# Patient Record
Sex: Female | Born: 1935 | Race: White | Hispanic: No | Smoking: Former smoker
Health system: Southern US, Community
[De-identification: ages and names within clinical notes are randomized; demographics above are authoritative.]

## PROBLEM LIST (undated history)

## (undated) DIAGNOSIS — IMO0001 Reserved for inherently not codable concepts without codable children: Secondary | ICD-10-CM

## (undated) DIAGNOSIS — K7290 Hepatic failure, unspecified without coma: Secondary | ICD-10-CM

## (undated) DIAGNOSIS — K056 Periodontal disease, unspecified: Secondary | ICD-10-CM

## (undated) DIAGNOSIS — K219 Gastro-esophageal reflux disease without esophagitis: Secondary | ICD-10-CM

## (undated) DIAGNOSIS — K7581 Nonalcoholic steatohepatitis (NASH): Secondary | ICD-10-CM

## (undated) DIAGNOSIS — K6389 Other specified diseases of intestine: Secondary | ICD-10-CM

## (undated) DIAGNOSIS — E860 Dehydration: Secondary | ICD-10-CM

## (undated) DIAGNOSIS — F32A Depression, unspecified: Secondary | ICD-10-CM

## (undated) DIAGNOSIS — R188 Other ascites: Secondary | ICD-10-CM

## (undated) DIAGNOSIS — C801 Malignant (primary) neoplasm, unspecified: Secondary | ICD-10-CM

## (undated) DIAGNOSIS — Z85828 Personal history of other malignant neoplasm of skin: Secondary | ICD-10-CM

## (undated) DIAGNOSIS — K209 Esophagitis, unspecified: Secondary | ICD-10-CM

## (undated) DIAGNOSIS — F329 Major depressive disorder, single episode, unspecified: Secondary | ICD-10-CM

## (undated) DIAGNOSIS — D696 Thrombocytopenia, unspecified: Secondary | ICD-10-CM

## (undated) DIAGNOSIS — K3189 Other diseases of stomach and duodenum: Secondary | ICD-10-CM

## (undated) DIAGNOSIS — K7460 Unspecified cirrhosis of liver: Secondary | ICD-10-CM

## (undated) DIAGNOSIS — I8510 Secondary esophageal varices without bleeding: Secondary | ICD-10-CM

## (undated) DIAGNOSIS — F419 Anxiety disorder, unspecified: Secondary | ICD-10-CM

## (undated) DIAGNOSIS — K760 Fatty (change of) liver, not elsewhere classified: Secondary | ICD-10-CM

## (undated) DIAGNOSIS — J449 Chronic obstructive pulmonary disease, unspecified: Secondary | ICD-10-CM

## (undated) DIAGNOSIS — K222 Esophageal obstruction: Secondary | ICD-10-CM

## (undated) DIAGNOSIS — C44621 Squamous cell carcinoma of skin of unspecified upper limb, including shoulder: Secondary | ICD-10-CM

## (undated) DIAGNOSIS — K639 Disease of intestine, unspecified: Secondary | ICD-10-CM

## (undated) DIAGNOSIS — K068 Other specified disorders of gingiva and edentulous alveolar ridge: Secondary | ICD-10-CM

## (undated) DIAGNOSIS — K766 Portal hypertension: Secondary | ICD-10-CM

## (undated) DIAGNOSIS — D732 Chronic congestive splenomegaly: Secondary | ICD-10-CM

## (undated) HISTORY — DX: Other diseases of stomach and duodenum: K31.89

## (undated) HISTORY — DX: Other specified disorders of gingiva and edentulous alveolar ridge: K06.8

## (undated) HISTORY — DX: Portal hypertension: K76.6

## (undated) HISTORY — DX: Periodontal disease, unspecified: K05.6

## (undated) HISTORY — DX: Major depressive disorder, single episode, unspecified: F32.9

## (undated) HISTORY — DX: Dehydration: E86.0

## (undated) HISTORY — DX: Disease of intestine, unspecified: K63.9

## (undated) HISTORY — DX: Unspecified cirrhosis of liver: K74.60

## (undated) HISTORY — DX: Depression, unspecified: F32.A

## (undated) HISTORY — PX: VAGINAL HYSTERECTOMY: SUR661

## (undated) HISTORY — DX: Hepatic failure, unspecified without coma: K72.90

## (undated) HISTORY — DX: Secondary esophageal varices without bleeding: I85.10

## (undated) HISTORY — DX: Thrombocytopenia, unspecified: D69.6

## (undated) HISTORY — DX: Fatty (change of) liver, not elsewhere classified: K76.0

## (undated) HISTORY — DX: Other specified diseases of intestine: K63.89

## (undated) HISTORY — DX: Chronic congestive splenomegaly: D73.2

## (undated) HISTORY — DX: Esophagitis, unspecified: K20.9

## (undated) HISTORY — PX: APPENDECTOMY: SHX54

## (undated) HISTORY — DX: Other ascites: R18.8

## (undated) HISTORY — DX: Personal history of other malignant neoplasm of skin: Z85.828

## (undated) HISTORY — DX: Esophageal obstruction: K22.2

## (undated) HISTORY — PX: BACK SURGERY: SHX140

## (undated) HISTORY — DX: Nonalcoholic steatohepatitis (NASH): K75.81

## (undated) HISTORY — DX: Squamous cell carcinoma of skin of unspecified upper limb, including shoulder: C44.621

---

## 1988-09-10 DIAGNOSIS — K209 Esophagitis, unspecified without bleeding: Secondary | ICD-10-CM

## 1988-09-10 HISTORY — DX: Esophagitis, unspecified without bleeding: K20.90

## 2005-01-29 ENCOUNTER — Encounter: Payer: Self-pay | Admitting: Geriatric Medicine

## 2005-02-10 ENCOUNTER — Encounter: Payer: Self-pay | Admitting: Geriatric Medicine

## 2010-11-16 DIAGNOSIS — M546 Pain in thoracic spine: Secondary | ICD-10-CM | POA: Insufficient documentation

## 2012-05-25 DIAGNOSIS — Z85828 Personal history of other malignant neoplasm of skin: Secondary | ICD-10-CM

## 2012-05-25 HISTORY — DX: Personal history of other malignant neoplasm of skin: Z85.828

## 2012-07-09 DIAGNOSIS — C44621 Squamous cell carcinoma of skin of unspecified upper limb, including shoulder: Secondary | ICD-10-CM

## 2012-07-09 HISTORY — DX: Squamous cell carcinoma of skin of unspecified upper limb, including shoulder: C44.621

## 2012-08-25 ENCOUNTER — Emergency Department: Payer: Self-pay | Admitting: Emergency Medicine

## 2012-08-25 LAB — CBC
HCT: 41.4 % (ref 35.0–47.0)
HGB: 13.7 g/dL (ref 12.0–16.0)
MCH: 31.5 pg (ref 26.0–34.0)
MCV: 95 fL (ref 80–100)
RBC: 4.35 10*6/uL (ref 3.80–5.20)
RDW: 15.1 % — ABNORMAL HIGH (ref 11.5–14.5)

## 2012-08-25 LAB — COMPREHENSIVE METABOLIC PANEL
Albumin: 3.1 g/dL — ABNORMAL LOW (ref 3.4–5.0)
Alkaline Phosphatase: 106 U/L (ref 50–136)
Anion Gap: 7 (ref 7–16)
Calcium, Total: 8.6 mg/dL (ref 8.5–10.1)
Chloride: 105 mmol/L (ref 98–107)
Co2: 26 mmol/L (ref 21–32)
EGFR (African American): >60
EGFR (Non-African Amer.): >60
Glucose: 96 mg/dL (ref 65–99)
Osmolality: 275 (ref 275–301)
Potassium: 4.1 mmol/L (ref 3.5–5.1)
SGOT(AST): 26 U/L (ref 15–37)
Total Protein: 6.1 g/dL — ABNORMAL LOW (ref 6.4–8.2)

## 2012-08-25 LAB — TROPONIN I: Troponin-I: <0.02 ng/mL

## 2012-08-25 LAB — CK TOTAL AND CKMB (NOT AT ARMC): CK, Total: 66 U/L (ref 21–215)

## 2012-09-30 DIAGNOSIS — K639 Disease of intestine, unspecified: Secondary | ICD-10-CM

## 2012-09-30 DIAGNOSIS — K6389 Other specified diseases of intestine: Secondary | ICD-10-CM

## 2012-09-30 DIAGNOSIS — I8510 Secondary esophageal varices without bleeding: Secondary | ICD-10-CM

## 2012-09-30 DIAGNOSIS — K766 Portal hypertension: Secondary | ICD-10-CM | POA: Insufficient documentation

## 2012-09-30 DIAGNOSIS — K3189 Other diseases of stomach and duodenum: Secondary | ICD-10-CM

## 2012-09-30 HISTORY — DX: Other diseases of stomach and duodenum: K31.89

## 2012-09-30 HISTORY — DX: Disease of intestine, unspecified: K63.9

## 2012-09-30 HISTORY — DX: Secondary esophageal varices without bleeding: I85.10

## 2012-09-30 HISTORY — DX: Other specified diseases of intestine: K63.89

## 2012-11-19 ENCOUNTER — Inpatient Hospital Stay: Payer: Self-pay | Admitting: Internal Medicine

## 2012-11-19 LAB — CBC WITH DIFFERENTIAL/PLATELET
Basophil #: 0 10*3/uL (ref 0.0–0.1)
Basophil %: 0.5 %
HGB: 10.3 g/dL — ABNORMAL LOW (ref 12.0–16.0)
Lymphocyte %: 16.3 %
MCH: 32 pg (ref 26.0–34.0)
MCHC: 32.9 g/dL (ref 32.0–36.0)
MCV: 97 fL (ref 80–100)
Monocyte #: 0.7 x10 3/mm (ref 0.2–0.9)
Monocyte %: 9.4 %
Neutrophil #: 5.5 10*3/uL (ref 1.4–6.5)
Platelet: 56 10*3/uL — ABNORMAL LOW (ref 150–440)
RBC: 3.22 10*6/uL — ABNORMAL LOW (ref 3.80–5.20)

## 2012-11-19 LAB — LIPASE, BLOOD: Lipase: 247 U/L (ref 73–393)

## 2012-11-19 LAB — PROTIME-INR
INR: 1.3
Prothrombin Time: 16.2 secs — ABNORMAL HIGH (ref 11.5–14.7)

## 2012-11-19 LAB — CBC
HCT: 36.8 % (ref 35.0–47.0)
HGB: 12.3 g/dL (ref 12.0–16.0)
MCHC: 33.3 g/dL (ref 32.0–36.0)
Platelet: 88 10*3/uL — ABNORMAL LOW (ref 150–440)
RBC: 3.85 10*6/uL (ref 3.80–5.20)
RDW: 14.4 % (ref 11.5–14.5)

## 2012-11-19 LAB — COMPREHENSIVE METABOLIC PANEL
Alkaline Phosphatase: 134 U/L (ref 50–136)
Anion Gap: 5 — ABNORMAL LOW (ref 7–16)
BUN: 43 mg/dL — ABNORMAL HIGH (ref 7–18)
Bilirubin,Total: 1.6 mg/dL — ABNORMAL HIGH (ref 0.2–1.0)
Calcium, Total: 8.7 mg/dL (ref 8.5–10.1)
Creatinine: 1.04 mg/dL (ref 0.60–1.30)
EGFR (African American): 59 — ABNORMAL LOW
Glucose: 115 mg/dL — ABNORMAL HIGH (ref 65–99)
SGOT(AST): 36 U/L (ref 15–37)
SGPT (ALT): 26 U/L (ref 12–78)
Sodium: 139 mmol/L (ref 136–145)
Total Protein: 6 g/dL — ABNORMAL LOW (ref 6.4–8.2)

## 2012-11-20 LAB — CBC WITH DIFFERENTIAL/PLATELET
Basophil #: 0.1 10*3/uL (ref 0.0–0.1)
Basophil %: 1.3 %
Eosinophil #: 0.1 10*3/uL (ref 0.0–0.7)
Eosinophil %: 1.3 %
HGB: 9.1 g/dL — ABNORMAL LOW (ref 12.0–16.0)
HGB: 8.4 g/dL — ABNORMAL LOW (ref 12.0–16.0)
Lymphocyte %: 28.4 %
MCHC: 33.4 g/dL (ref 32.0–36.0)
MCHC: 32.9 g/dL (ref 32.0–36.0)
MCV: 95 fL (ref 80–100)
MCV: 95 fL (ref 80–100)
Monocyte #: 0.9 x10 3/mm (ref 0.2–0.9)
Neutrophil #: 4.4 10*3/uL (ref 1.4–6.5)
Neutrophil #: 3.9 10*3/uL (ref 1.4–6.5)
Neutrophil %: 58.4 %
Platelet: 89 10*3/uL — ABNORMAL LOW (ref 150–440)
RBC: 2.86 10*6/uL — ABNORMAL LOW (ref 3.80–5.20)
RDW: 14.6 % — ABNORMAL HIGH (ref 11.5–14.5)
RDW: 14.6 % — ABNORMAL HIGH (ref 11.5–14.5)
WBC: 7.6 10*3/uL (ref 3.6–11.0)
WBC: 6.4 10*3/uL (ref 3.6–11.0)

## 2012-11-20 LAB — BASIC METABOLIC PANEL
BUN: 51 mg/dL — ABNORMAL HIGH (ref 7–18)
Calcium, Total: 8 mg/dL — ABNORMAL LOW (ref 8.5–10.1)
EGFR (African American): >60
Glucose: 101 mg/dL — ABNORMAL HIGH (ref 65–99)
Osmolality: 301 (ref 275–301)
Potassium: 5 mmol/L (ref 3.5–5.1)

## 2012-11-21 LAB — BASIC METABOLIC PANEL
Anion Gap: 5 — ABNORMAL LOW (ref 7–16)
Calcium, Total: 7.8 mg/dL — ABNORMAL LOW (ref 8.5–10.1)
Chloride: 114 mmol/L — ABNORMAL HIGH (ref 98–107)
Creatinine: 1.17 mg/dL (ref 0.60–1.30)
EGFR (Non-African Amer.): 44 — ABNORMAL LOW
Glucose: 107 mg/dL — ABNORMAL HIGH (ref 65–99)
Osmolality: 301 (ref 275–301)

## 2012-11-21 LAB — CBC WITH DIFFERENTIAL/PLATELET
Basophil %: 0.8 %
Eosinophil #: 0.2 10*3/uL (ref 0.0–0.7)
Eosinophil %: 3.5 %
HGB: 7.8 g/dL — ABNORMAL LOW (ref 12.0–16.0)
Lymphocyte #: 1.6 10*3/uL (ref 1.0–3.6)
Lymphocyte %: 30.8 %
MCH: 31.8 pg (ref 26.0–34.0)
MCV: 96 fL (ref 80–100)
Monocyte %: 14.5 %
Neutrophil #: 2.7 10*3/uL (ref 1.4–6.5)
Neutrophil %: 50.4 %
Platelet: 72 10*3/uL — ABNORMAL LOW (ref 150–440)
RDW: 15.1 % — ABNORMAL HIGH (ref 11.5–14.5)
WBC: 5.3 10*3/uL (ref 3.6–11.0)

## 2012-11-21 LAB — MAGNESIUM: Magnesium: 1.7 mg/dL — ABNORMAL LOW

## 2012-11-22 LAB — HEMOGLOBIN: HGB: 8.3 g/dL — ABNORMAL LOW (ref 12.0–16.0)

## 2012-11-26 ENCOUNTER — Other Ambulatory Visit: Payer: Self-pay | Admitting: Family Medicine

## 2012-11-26 LAB — CBC WITH DIFFERENTIAL/PLATELET
Basophil #: 0 10*3/uL (ref 0.0–0.1)
Basophil %: 0.5 %
Eosinophil #: 0.1 10*3/uL (ref 0.0–0.7)
HGB: 9.2 g/dL — ABNORMAL LOW (ref 12.0–16.0)
Lymphocyte #: 1.3 10*3/uL (ref 1.0–3.6)
Lymphocyte %: 21.8 %
MCH: 32.2 pg (ref 26.0–34.0)
MCHC: 33.6 g/dL (ref 32.0–36.0)
Monocyte %: 12.4 %
Neutrophil #: 3.8 10*3/uL (ref 1.4–6.5)
Platelet: 75 10*3/uL — ABNORMAL LOW (ref 150–440)
RBC: 2.86 10*6/uL — ABNORMAL LOW (ref 3.80–5.20)
RDW: 15.4 % — ABNORMAL HIGH (ref 11.5–14.5)
WBC: 6 10*3/uL (ref 3.6–11.0)

## 2013-01-13 DIAGNOSIS — D732 Chronic congestive splenomegaly: Secondary | ICD-10-CM

## 2013-01-13 DIAGNOSIS — K068 Other specified disorders of gingiva and edentulous alveolar ridge: Secondary | ICD-10-CM

## 2013-01-13 DIAGNOSIS — K056 Periodontal disease, unspecified: Secondary | ICD-10-CM

## 2013-01-13 DIAGNOSIS — D696 Thrombocytopenia, unspecified: Secondary | ICD-10-CM

## 2013-01-13 HISTORY — DX: Periodontal disease, unspecified: K05.6

## 2013-01-13 HISTORY — DX: Thrombocytopenia, unspecified: D69.6

## 2013-01-13 HISTORY — DX: Chronic congestive splenomegaly: D73.2

## 2013-01-13 HISTORY — DX: Other specified disorders of gingiva and edentulous alveolar ridge: K06.8

## 2013-03-22 DIAGNOSIS — R188 Other ascites: Secondary | ICD-10-CM

## 2013-03-22 HISTORY — DX: Other ascites: R18.8

## 2013-04-15 ENCOUNTER — Ambulatory Visit: Payer: Self-pay | Admitting: Gastroenterology

## 2013-04-19 ENCOUNTER — Ambulatory Visit: Payer: Self-pay | Admitting: Gastroenterology

## 2013-05-17 ENCOUNTER — Ambulatory Visit: Payer: Self-pay | Admitting: Gastroenterology

## 2013-05-17 LAB — CREATININE, SERUM
Creatinine: 1.13 mg/dL (ref 0.60–1.30)
EGFR (African American): 54 — ABNORMAL LOW
GFR CALC NON AF AMER: 46 — AB

## 2013-05-17 LAB — POTASSIUM: POTASSIUM: 3.7 mmol/L (ref 3.5–5.1)

## 2013-05-24 ENCOUNTER — Ambulatory Visit: Payer: Self-pay | Admitting: Internal Medicine

## 2013-05-24 LAB — PLATELET COUNT: Platelet: 73 10*3/uL — ABNORMAL LOW (ref 150–440)

## 2013-05-24 LAB — TSH: Thyroid Stimulating Horm: 3.28 u[IU]/mL

## 2013-05-25 LAB — CEA: CEA: 4.2 ng/mL (ref 0.0–4.7)

## 2013-06-13 ENCOUNTER — Ambulatory Visit: Payer: Self-pay | Admitting: Internal Medicine

## 2013-07-15 ENCOUNTER — Ambulatory Visit: Payer: Self-pay | Admitting: Gastroenterology

## 2013-08-19 ENCOUNTER — Ambulatory Visit: Payer: Self-pay | Admitting: Pain Medicine

## 2013-08-27 ENCOUNTER — Other Ambulatory Visit: Payer: Self-pay | Admitting: Pain Medicine

## 2013-08-27 LAB — BASIC METABOLIC PANEL
ANION GAP: 3 — AB (ref 7–16)
BUN: 13 mg/dL (ref 7–18)
CALCIUM: 8.5 mg/dL (ref 8.5–10.1)
CHLORIDE: 100 mmol/L (ref 98–107)
CREATININE: 1.04 mg/dL (ref 0.60–1.30)
Co2: 33 mmol/L — ABNORMAL HIGH (ref 21–32)
GFR CALC AF AMER: 59 — AB
GFR CALC NON AF AMER: 51 — AB
Glucose: 102 mg/dL — ABNORMAL HIGH (ref 65–99)
Osmolality: 272 (ref 275–301)
Potassium: 4.1 mmol/L (ref 3.5–5.1)
Sodium: 136 mmol/L (ref 136–145)

## 2013-08-27 LAB — MAGNESIUM: Magnesium: 1.8 mg/dL

## 2013-08-27 LAB — HEPATIC FUNCTION PANEL A (ARMC)
Albumin: 2.9 g/dL — ABNORMAL LOW (ref 3.4–5.0)
Alkaline Phosphatase: 133 U/L — ABNORMAL HIGH
BILIRUBIN DIRECT: 0.3 mg/dL — AB (ref 0.00–0.20)
BILIRUBIN TOTAL: 0.9 mg/dL (ref 0.2–1.0)
SGOT(AST): 28 U/L (ref 15–37)
SGPT (ALT): 21 U/L (ref 12–78)
TOTAL PROTEIN: 5.8 g/dL — AB (ref 6.4–8.2)

## 2013-08-27 LAB — SEDIMENTATION RATE: Erythrocyte Sed Rate: 8 mm/hr (ref 0–30)

## 2013-09-01 ENCOUNTER — Ambulatory Visit: Payer: Self-pay | Admitting: Pain Medicine

## 2013-09-27 DIAGNOSIS — K760 Fatty (change of) liver, not elsewhere classified: Secondary | ICD-10-CM

## 2013-09-27 DIAGNOSIS — K7460 Unspecified cirrhosis of liver: Secondary | ICD-10-CM | POA: Insufficient documentation

## 2013-09-27 HISTORY — DX: Fatty (change of) liver, not elsewhere classified: K76.0

## 2013-09-30 ENCOUNTER — Ambulatory Visit: Payer: Self-pay | Admitting: Gastroenterology

## 2013-10-14 ENCOUNTER — Ambulatory Visit: Payer: Self-pay | Admitting: Gastroenterology

## 2013-10-15 ENCOUNTER — Ambulatory Visit: Payer: Self-pay | Admitting: Pain Medicine

## 2013-11-05 ENCOUNTER — Ambulatory Visit: Payer: Self-pay | Admitting: Gastroenterology

## 2013-11-05 LAB — CBC WITH DIFFERENTIAL/PLATELET
BASOS PCT: 0.7 %
Basophil #: 0 10*3/uL (ref 0.0–0.1)
EOS ABS: 0.1 10*3/uL (ref 0.0–0.7)
Eosinophil %: 2.7 %
HCT: 37.1 % (ref 35.0–47.0)
HGB: 12.4 g/dL (ref 12.0–16.0)
LYMPHS ABS: 0.8 10*3/uL — AB (ref 1.0–3.6)
Lymphocyte %: 21 %
MCH: 31.1 pg (ref 26.0–34.0)
MCHC: 33.5 g/dL (ref 32.0–36.0)
MCV: 93 fL (ref 80–100)
Monocyte #: 0.5 x10 3/mm (ref 0.2–0.9)
Monocyte %: 12.9 %
NEUTROS ABS: 2.4 10*3/uL (ref 1.4–6.5)
NEUTROS PCT: 62.7 %
Platelet: 69 10*3/uL — ABNORMAL LOW (ref 150–440)
RBC: 3.99 10*6/uL (ref 3.80–5.20)
RDW: 15.3 % — ABNORMAL HIGH (ref 11.5–14.5)
WBC: 3.8 10*3/uL (ref 3.6–11.0)

## 2013-11-05 LAB — PROTIME-INR
INR: 1.3
PROTHROMBIN TIME: 16.1 s — AB (ref 11.5–14.7)

## 2013-11-05 LAB — APTT: Activated PTT: 33.1 secs (ref 23.6–35.9)

## 2013-11-24 ENCOUNTER — Ambulatory Visit: Payer: Self-pay | Admitting: Gastroenterology

## 2013-11-26 ENCOUNTER — Emergency Department: Payer: Self-pay | Admitting: Emergency Medicine

## 2013-11-26 LAB — CBC WITH DIFFERENTIAL/PLATELET
Basophil #: 0 10*3/uL (ref 0.0–0.1)
Basophil %: 0.9 %
Eosinophil #: 0.1 10*3/uL (ref 0.0–0.7)
Eosinophil %: 3.1 %
HCT: 38.6 % (ref 35.0–47.0)
HGB: 12.6 g/dL (ref 12.0–16.0)
Lymphocyte #: 1 10*3/uL (ref 1.0–3.6)
Lymphocyte %: 24.4 %
MCH: 30.8 pg (ref 26.0–34.0)
MCHC: 32.5 g/dL (ref 32.0–36.0)
MCV: 95 fL (ref 80–100)
MONO ABS: 0.6 x10 3/mm (ref 0.2–0.9)
MONOS PCT: 14.2 %
Neutrophil #: 2.3 10*3/uL (ref 1.4–6.5)
Neutrophil %: 57.4 %
PLATELETS: 84 10*3/uL — AB (ref 150–440)
RBC: 4.08 10*6/uL (ref 3.80–5.20)
RDW: 14.9 % — ABNORMAL HIGH (ref 11.5–14.5)
WBC: 4 10*3/uL (ref 3.6–11.0)

## 2013-11-26 LAB — URINALYSIS, COMPLETE
BILIRUBIN, UR: NEGATIVE
Bacteria: NONE SEEN
Blood: NEGATIVE
GLUCOSE, UR: NEGATIVE mg/dL (ref 0–75)
Ketone: NEGATIVE
LEUKOCYTE ESTERASE: NEGATIVE
Nitrite: NEGATIVE
Ph: 5 (ref 4.5–8.0)
Protein: NEGATIVE
RBC,UR: <1 /HPF (ref 0–5)
Specific Gravity: 1.009 (ref 1.003–1.030)
Squamous Epithelial: 1
WBC UR: 1 /HPF (ref 0–5)

## 2013-11-26 LAB — COMPREHENSIVE METABOLIC PANEL
ALK PHOS: 103 U/L
ALT: 20 U/L (ref 12–78)
Albumin: 3.1 g/dL — ABNORMAL LOW (ref 3.4–5.0)
Anion Gap: 4 — ABNORMAL LOW (ref 7–16)
BUN: 13 mg/dL (ref 7–18)
Bilirubin,Total: 1.2 mg/dL — ABNORMAL HIGH (ref 0.2–1.0)
CREATININE: 1.39 mg/dL — AB (ref 0.60–1.30)
Calcium, Total: 8.3 mg/dL — ABNORMAL LOW (ref 8.5–10.1)
Chloride: 94 mmol/L — ABNORMAL LOW (ref 98–107)
Co2: 30 mmol/L (ref 21–32)
EGFR (African American): 41 — ABNORMAL LOW
GFR CALC NON AF AMER: 36 — AB
GLUCOSE: 115 mg/dL — AB (ref 65–99)
Osmolality: 258 (ref 275–301)
Potassium: 4.1 mmol/L (ref 3.5–5.1)
SGOT(AST): 34 U/L (ref 15–37)
SODIUM: 128 mmol/L — AB (ref 136–145)
Total Protein: 5.9 g/dL — ABNORMAL LOW (ref 6.4–8.2)

## 2013-11-26 LAB — AMMONIA: Ammonia, Plasma: 40 mcmol/L — ABNORMAL HIGH (ref 11–32)

## 2013-11-26 LAB — LIPASE, BLOOD: Lipase: 274 U/L (ref 73–393)

## 2013-12-02 ENCOUNTER — Ambulatory Visit: Payer: Self-pay | Admitting: Gastroenterology

## 2013-12-08 ENCOUNTER — Ambulatory Visit: Payer: Self-pay | Admitting: Gastroenterology

## 2013-12-22 ENCOUNTER — Ambulatory Visit: Payer: Self-pay | Admitting: Gastroenterology

## 2014-01-05 ENCOUNTER — Ambulatory Visit: Payer: Self-pay | Admitting: Gastroenterology

## 2014-01-19 ENCOUNTER — Ambulatory Visit: Payer: Self-pay | Admitting: Gastroenterology

## 2014-01-20 ENCOUNTER — Ambulatory Visit: Payer: Self-pay | Admitting: Gastroenterology

## 2014-01-28 ENCOUNTER — Ambulatory Visit: Payer: Self-pay | Admitting: Pain Medicine

## 2014-02-02 ENCOUNTER — Ambulatory Visit: Payer: Self-pay | Admitting: Gastroenterology

## 2014-02-09 ENCOUNTER — Ambulatory Visit: Payer: Self-pay | Admitting: Gastroenterology

## 2014-02-18 ENCOUNTER — Ambulatory Visit: Payer: Self-pay | Admitting: Gastroenterology

## 2014-02-23 ENCOUNTER — Ambulatory Visit: Payer: Self-pay | Admitting: Gastroenterology

## 2014-03-02 ENCOUNTER — Ambulatory Visit: Payer: Self-pay | Admitting: Gastroenterology

## 2014-03-09 ENCOUNTER — Ambulatory Visit: Payer: Self-pay | Admitting: Gastroenterology

## 2014-03-16 ENCOUNTER — Ambulatory Visit: Payer: Self-pay | Admitting: Gastroenterology

## 2014-03-23 ENCOUNTER — Ambulatory Visit: Payer: Self-pay | Admitting: Gastroenterology

## 2014-03-30 ENCOUNTER — Ambulatory Visit: Payer: Self-pay | Admitting: Gastroenterology

## 2014-04-06 ENCOUNTER — Ambulatory Visit: Payer: Self-pay | Admitting: Gastroenterology

## 2014-04-13 ENCOUNTER — Ambulatory Visit: Payer: Self-pay | Admitting: Gastroenterology

## 2014-04-20 ENCOUNTER — Ambulatory Visit: Payer: Self-pay | Admitting: Gastroenterology

## 2014-04-22 ENCOUNTER — Ambulatory Visit: Payer: Self-pay | Admitting: Gastroenterology

## 2014-04-22 LAB — CREATININE, SERUM
Creatinine: 1.53 mg/dL — ABNORMAL HIGH (ref 0.60–1.30)
EGFR (Non-African Amer.): 35 — ABNORMAL LOW
GFR CALC AF AMER: 42 — AB

## 2014-04-27 ENCOUNTER — Ambulatory Visit: Payer: Self-pay | Admitting: Gastroenterology

## 2014-05-04 ENCOUNTER — Ambulatory Visit: Payer: Self-pay | Admitting: Gastroenterology

## 2014-05-11 ENCOUNTER — Ambulatory Visit: Payer: Self-pay | Admitting: Gastroenterology

## 2014-05-25 ENCOUNTER — Ambulatory Visit: Payer: Self-pay | Admitting: Gastroenterology

## 2014-06-08 ENCOUNTER — Ambulatory Visit: Payer: Self-pay | Admitting: Gastroenterology

## 2014-06-22 ENCOUNTER — Ambulatory Visit: Payer: Self-pay | Admitting: Gastroenterology

## 2014-07-06 ENCOUNTER — Ambulatory Visit: Payer: Self-pay | Admitting: Gastroenterology

## 2014-07-20 ENCOUNTER — Ambulatory Visit: Payer: Self-pay | Admitting: Gastroenterology

## 2014-07-27 ENCOUNTER — Ambulatory Visit: Payer: Self-pay | Admitting: Gastroenterology

## 2014-08-10 ENCOUNTER — Ambulatory Visit
Admit: 2014-08-10 | Disposition: A | Discharge status: Home / Self Care | Payer: Self-pay | Attending: Gastroenterology | Admitting: Gastroenterology

## 2014-08-17 ENCOUNTER — Ambulatory Visit
Admit: 2014-08-17 | Disposition: A | Discharge status: Home / Self Care | Payer: Self-pay | Attending: Gastroenterology | Admitting: Gastroenterology

## 2014-08-23 ENCOUNTER — Other Ambulatory Visit: Payer: Self-pay | Admitting: Gastroenterology

## 2014-08-23 DIAGNOSIS — K7581 Nonalcoholic steatohepatitis (NASH): Secondary | ICD-10-CM

## 2014-08-23 DIAGNOSIS — K7460 Unspecified cirrhosis of liver: Secondary | ICD-10-CM

## 2014-08-23 DIAGNOSIS — R188 Other ascites: Principal | ICD-10-CM

## 2014-08-26 ENCOUNTER — Other Ambulatory Visit: Payer: Self-pay | Admitting: Gastroenterology

## 2014-08-26 DIAGNOSIS — R188 Other ascites: Secondary | ICD-10-CM

## 2014-08-31 ENCOUNTER — Ambulatory Visit
Admit: 2014-08-31 | Disposition: A | Discharge status: Home / Self Care | Payer: Self-pay | Attending: Gastroenterology | Admitting: Gastroenterology

## 2014-09-02 NOTE — Consult Note (Signed)
CC: GI bleeding.  Pt EGD showed no evidence of variceal bleeding.  No blood present, no red signs, no fibrin plugs.  Stomach showed diffuse signif portal hypertensive gastropathy and evidence of an intact Nissan fundoplication.  Duodenum neg.  Will start clear liquids and continue Octreotide today and taper off over next 1-2 days.  Can be moved from CCU to floor with telemetry monitor-off unit OK.  Check daily CBC.  Continue Levaquid for 3 days total.  Electronic Signatures: Manya Silvas (MD)  (Signed on 11-Jul-14 12:06)  Authored  Last Updated: 11-Jul-14 12:06 by Manya Silvas (MD)

## 2014-09-02 NOTE — Discharge Summary (Signed)
PATIENT NAME:  Susan May, Susan May MR#:  [id number] DATE OF BIRTH:  01/19/1932  DATE OF ADMISSION:  11/19/2012 DATE OF DISCHARGE:  11/23/2012  PRESENTING COMPLAINT:  Bloody diarrhea.   DISCHARGE DIAGNOSES: 1.  Acute gastrointestinal bleed with posthemorrhagic anemia status post 1 unit blood transfusion, resolved, improved.  2.  Grade 2 varices with moderate portal hypertensive gastropathy found on gastric antrum on EGD.  3.  The patient is status post Nissen fundoplication.  4.  Chronic cirrhosis of liver with minimal ascites. 5.  Generalized weakness.  6.  Recent right lower teeth extraction at [facility], status post ecchymosis around the chin and neck.  7.  Chronic obstructive pulmonary disease, stable.   CONDITION ON DISCHARGE: Fair.   MEDICATIONS: 1.  Acetaminophen/hydrocodone 5/325 mg 1 p.o. q. 4 hours p.r.n.  2.  Levaquin 250 mg p.o. daily, prophylaxis in the setting of acute gastrointestinal bleed, cirrhosis and ascites.  3.  Magnesium oxide 400 mg b.i.d.  4.  Simethicone 80 to 160 mg q. 4 hours p.r.n. for indigestion.  5.  Klonopin 0.5 mg in the evening.  6.  Nexium 40 mg b.i.d.  7.  Fluoxetine 20 mg daily.  8.  Lasix 40 mg b.i.d.  9.  Spironolactone 25 mg b.i.d.  10.  Gabapentin 300 mg b.i.d.   CONSULTANTS:  GI with Dr. Vira Agar.   LABORATORY AND DIAGNOSTICS: EGD showed grade 2 esophageal varices and portal hypertensive gastropathy. Nissen fundoplication was found. Hemoglobin at discharge is 8.3, white count 5.3 and platelet count is 72. Creatinine is 1.17 and potassium is 4.2. CT of the abdomen and pelvis showed findings suggestive of severe cirrhosis with splenomegaly and probable varices suggesting portal hypertension, severe ascites, gallstones cannot be excluded.   BRIEF SUMMARY OF HOSPITAL COURSE:  Susan May is a very pleasant 81 year old Caucasian female who has past medical history of cirrhosis of liver with ascites and chronic thrombocytopenia who had 3 teeth  extracted at [facility] comes in day after that with:  1.  GI bleed/rectal bleed with dark maroon stools. She was started on IV Protonix b.i.d. IV octreotide was added by Dr. Vira Agar who saw the patient in consultation. Given her cirrhosis of liver and GI bleed, she underwent EGD which did not show any active bleeding. However, it did show some gastric varices and portal hypertensive gastropathy. The patient's hemoglobin dropped from 12.3 to 7.2 and she got 1 unit of blood transfusion. Thereafter, her hemoglobin still remained stable to 8.3. She did not have anymore bloody diarrhea, was started on clear liquid diet and advanced to soft diet prior to discharge.  2.  Hypovolemic shock secondary to GI bleed. Blood pressure improved after IV fluids. She did not require any pressors.  3.  Acute blood loss anemia. The patient got 1 unit of blood transfusion since her hemoglobin dropped down from 12.3 to 7.2.  4.  Cirrhosis of liver with coagulopathy and thrombocytopenia along with ascites, portal hypertension and splenomegaly. She follows up with [facility] gastroenterology. Her Lasix and spironolactone were resumed.  5.  Chronic back pain, on methadone. The patient did not want to resume methadone; hence, we left her on small dose of Norco.  6.  Generalized weakness. Physical therapy saw the patient and recommended rehab. The plan was discussed with the patient's daughter and the patient.  They were agreeable to it. The patient has accepted a bed at [facility]. She will be discharged today.  TIME SPENT: 40 minutes.  ____________________________ Gus Height  Hulda Humphrey, MD sap:sb D: 11/23/2012 12:19:22 ET T: 11/23/2012 12:45:44 ET JOB#: [id number]  cc: Kaleya Douse A. Posey Pronto, MD, <Dictator> [facility] Gastroenterology [facility] Internal Medicine Ilda Basset MD ELECTRONICALLY SIGNED 11/30/2012 19:04

## 2014-09-02 NOTE — Consult Note (Signed)
CC: GI bleeding.  Pt dropped her BP severely in ER 30 min after a fentanyl dose, hx of bleeding from mouth and passing blood reddish and dark in bowels several times since oral surgery yesterday.  She has a hx of cirrhosis, varices, last EGD about 10 months ago.   to GI bleed in cirrhotic will start empiric Levaquin.  Will start octreotide drip, agree with iv protonix and take a look into esophagus and stomach with possible banding tomorrow.  She understands the plans for EGD tomorrrow.   Electronic Signatures: Manya Silvas (MD)  (Signed on 10-Jul-14 19:01)  Authored  Last Updated: 10-Jul-14 19:01 by Manya Silvas (MD)

## 2014-09-02 NOTE — H&P (Signed)
PATIENT NAME:  Susan May, Susan May MR#:  [id number] DATE OF BIRTH:  1932/02/15  DATE OF ADMISSION:  11/19/2012  PRIMARY CARE PHYSICIAN:  Dr. Ivar Bury at [facility].   CHIEF COMPLAINT:  Bleeding per rectum and dry heaves with streaks of blood.   Susan May is a 81 year old Caucasian female with cirrhosis of liver along with portal hypertension and esophageal varices, comes to the Emergency Room after she started having rectal bleed last night. The patient says in the middle of the early hours of the morning she woke up having the urge to have a bowel movement, and she had about 2 or 3 bowel movements and her commode was filled up with dark maroon blood. Denies any abdominal pain. She then started having some dry heaving and spitting up saliva along with some streaks of blood. The patient also has had a dental procedure where 3 lower teeth on the right were pulled out at [facility] yesterday. She had some oozing of blood earlier yesterday and does have some bright red blood that is seen in the oral cavity today. She probably is spitting/vomiting that old blood that is left in her oral cavity. The patient is hypotensive with blood pressure systolic in the 19J. She is asymptomatic, otherwise, being admitted for further evaluation and management.    PAST SURGICAL HISTORY: 1.   Back surgery.  2.  The patient reports her stomach, she had the surgery call "where her stomach was wrapped"  and appears to be Nissen fundoplication. The patient is not sure of it, though.    PAST MEDICAL HISTORY: 1.  Cirrhosis of liver, cryptogenic, associated with portal hypertension, splenomegaly and acquired coagulopathy. The patient also has significant ascites.  2.  Chronic back pain.   ALLERGIES:  ASPIRIN CAUSES GI DISTRESS AND GI BLEED.   MEDICATIONS:  1.  Acetaminophen/hydrocodone 5/325, 1 tablet every 6 hours as needed.  2.  Clonazepam 0.5 mg p.o. daily.  3.  Fluoxetine 20 mg daily.  4.  Lasix 20 mg b.i.d.   5.  Gabapentin 200 mg b.i.d.  6.  Methadone 10 mg 2 tablets b.i.d.  7.  Nexium 40 mg daily at bedtime.  8.  Spironolactone 25 mg b.i.d.   FAMILY HISTORY:  Positive for mother with heart disease; father with prostate cancer.   SOCIAL HISTORY:  Lives with her daughter; ambulates without any difficulty. Nonsmoker. Nonalcoholic.   REVIEW OF SYSTEMS: CONSTITUTIONAL:  No fever, fatigue. Positive for weakness.  EYES:  No blurred or double vision.  ENT:  No tinnitus, ear pain, hearing loss. Positive for pain at site of tooth extraction.  RESPIRATORY:  No cough, wheeze, hemoptysis, or COPD.  CARDIOVASCULAR:  Negative for chest pain, orthopnea, edema or palpitations.  GASTROINTESTINAL:  Positive for nausea, no vomiting, abdominal pain. Positive for rectal bleeding.  GENITOURINARY:  No dysuria, hematuria or renal calculus.  ENDOCRINE:  No polyuria, nocturia or thyroid problems.  HEMATOLOGY:  No anemia or easy bruising.  SKIN:  No acne or rash.  MUSCULOSKELETAL:  Positive for arthritis. NEUROLOGIC:  No numbness, weakness, CVA or TIA.  PSYCHIATRIC:  No anxiety, depression or bipolar. All other systems reviewed and negative.   PHYSICAL EXAMINATION:  GENERAL:  The patient is awake, alert, oriented x 3, not in acute distress.  VITAL SIGNS:  Afebrile, pulse is 101, blood pressure initially was 122/59, sats are 98% on 2 L. Blood pressure dropped down to 84/55 and further dropped down to 43/33.  HEENT:   Atraumatic.  Pupils PERRLA. EOM intact. Oral mucosa does have some old crusted blood on the right lower jaw at the site of tooth extraction with stitches being seen. No active oozing of blood. The patient has significant bruise over her chin and neck due to her dental procedure.  NECK:  Supple. No JVD. No carotid bruit.  LUNGS:  Clear to auscultation bilaterally. No rales, rhonchi, respiratory distress or labored breathing.  HEART:  Both the heart sounds are normal. Rate is tachycardic. No murmur heard.  PMI not lateralized. Chest nontender.  EXTREMITIES:  Feeble pedal pulses, good femoral pulses. No lower extremity edema.  ABDOMEN:  Obese, distended with ascites. No tenderness on the abdomen. I could not feel any organomegaly.  NEUROLOGIC:  Grossly intact cranial nerves II through XII. No motor or sensory deficit, generalized weakness.  SKIN:  Warm and dry. The patient has significant bruise on her lower chin and neck secondary to her dental procedure.   LABORATORY, DIAGNOSTIC, AND RADIOLOGICAL DATA:  CT of the abdomen and pelvis shows findings suggestive of severe cirrhosis with splenomegaly, probable varices, suggestive of portal hypertension, severe ascites, gallstones present. Glucose is 115, BUN 43, creatinine 1.04, sodium 139, potassium 5.0, chloride is 108, bicarb is 26. SGPT is 26, SGOT is 36, H and H 12.3 and 36.8 and platelet count is 88. Lipase is 247.   ASSESSMENT:  [age]-year-old Susan May with a history significant for cirrhosis of liver with ascites, splenomegaly and portal hypertension along with a history of back pain, had a 3-tooth extraction at the [facility] yesterday, comes in today with:  1.  Gastrointestinal bleed/rectal bleed. The patient started having dark maroon stools since this morning early hours of the morning. She is hypotensive with hypovolemic shock. Her blood pressure is down to 43/33. She had one episode of dry heaving with spitting some blood, which appears to be more of her old blood from her dental surgery that was done yesterday. We will start the patient on IV Protonix b.i.d., do a Gastroenterology consult. Follow up CBC closely and continue IV fluids.  2.  Hypovolemic shock secondary to suspected gastrointestinal bleed. We will keep the patient in Intensive Care Unit, n.p.o. except medications. Give IV fluid bolus at this time. If blood pressure stays low, start vasopressors.  3.  Cirrhosis of liver with coagulopathy and  thrombocytopenia along with ascites, portal hypertension and splenomegaly. We will have Gastroenterology see patient. The patient used to follow up with [facility] Gastroenterology. Her etiology for cirrhosis is unknown at this time. We are awaiting the records from [facility].  4.  Chronic back pain on methadone. I will hold off on her pain meds at this time because of her blood pressure.  5.  Deep vein thrombosis prophylaxis. Sequential compression devices and TED hose.  6.  Gastrointestinal prophylaxis. IV Protonix.  7.  Further workup according to the patient's clinical course. The hospital admission plan was discussed with the patient and the patient's family members.    CRITICAL TIME SPENT:  60 minutes.   ____________________________ Hart Rochester Posey Pronto, MD sap:jm D: 11/19/2012 14:27:37 ET T: 11/19/2012 15:08:41 ET JOB#: [id number]  cc: Clide Remmers A. Posey Pronto, MD, <Dictator> Ilda Basset MD ELECTRONICALLY SIGNED 11/30/2012 19:04

## 2014-09-02 NOTE — Consult Note (Signed)
Chief Complaint:  Subjective/Chief Complaint This patient comes with an upper GI bleed and a history of cirrhosisi. She had an EGD with varices and portal gastropathy.   Brief Assessment:  GEN well developed, well nourished   Cardiac Regular   Respiratory normal resp effort   Gastrointestinal details normal Distended   Lab Results: Routine Chem:  12-Jul-14 05:18   Glucose, Serum  107  BUN  46  Creatinine (comp) 1.17  Sodium, Serum 145  Potassium, Serum 4.2  Chloride, Serum  114  CO2, Serum 26  Calcium (Total), Serum  7.8  Anion Gap  5  Osmolality (calc) 301  eGFR (African American)  51  eGFR (Non-African American)  44 (eGFR values <25mL/min/1.73 m2 may be an indication of chronic kidney disease (CKD). Calculated eGFR is useful in patients with stable renal function. The eGFR calculation will not be reliable in acutely ill patients when serum creatinine is changing rapidly. It is not useful in  patients on dialysis. The eGFR calculation may not be applicable to patients at the low and high extremes of body sizes, pregnant women, and vegetarians.)  Magnesium, Serum  1.7 (1.8-2.4 THERAPEUTIC RANGE: 4-7 mg/dL TOXIC: > 10 mg/dL  -----------------------)  Routine Hem:  12-Jul-14 05:18   WBC (CBC) 5.3  RBC (CBC)  2.44  Hemoglobin (CBC)  7.8  Hematocrit (CBC)  23.4  Platelet Count (CBC)  72  MCV 96  MCH 31.8  MCHC 33.1  RDW  15.1  Neutrophil % 50.4  Lymphocyte % 30.8  Monocyte % 14.5  Eosinophil % 3.5  Basophil % 0.8  Neutrophil # 2.7  Lymphocyte # 1.6  Monocyte # 0.8  Eosinophil # 0.2  Basophil # 0.0 (Result(s) reported on 21 Nov 2012 at 05:58AM.)   Assessment/Plan:  Assessment/Plan:  Assessment Upper GI bleed with cirrhosis and varices.   Plan The patient Hb is slightly lower. Chronic abd pain. Continue supportive care. Follow Hb.   Electronic Signatures: Lucilla Lame (MD)  (Signed 12-Jul-14 10:36)  Authored: Chief Complaint, Brief Assessment, Lab  Results, Assessment/Plan   Last Updated: 12-Jul-14 10:36 by Lucilla Lame (MD)

## 2014-09-02 NOTE — Consult Note (Signed)
PATIENT NAME:  Susan May, FILO MR#:  [id number] DATE OF BIRTH:  [birth year]  DATE OF CONSULTATION:  11/19/2012  CONSULTING PHYSICIAN:  Manya Silvas, MD  HISTORY OF PRESENT ILLNESS:  The patient is a 81 year old white female with a known history of idiopathic cirrhosis of the liver with varices and ascites. Her last endoscopy was about 10 months ago.    She had dental surgery and had 3 teeth removed yesterday. She had significant GI bleeding of maroonish and black stools with large amounts, according to the family. Came to the Emergency Room today and had a spell of severe drop in blood pressure briefly that responded to IV fluids, and she was admitted to the hospital for GI bleeding and spell of hypotension. I was asked to see her in consultation.   The patient denies ever being a drinker. She has never been diabetic. She has idiopathic cirrhosis and is followed by [facility].   PAST MEDICAL HISTORY: She has had a hysterectomy. She has had an appendectomy. She has had a fundoplication in the 2725D for reflux. She had dental surgery with 3 teeth removed. She says she definitely did not take any nonsteroidal anti-inflammatory drugs before or after the dental surgery yesterday.   PAST SURGICAL HISTORY:  Back surgery x 2.   ALLERGIES: ASPIRIN CAUSES GI BLEEDING.   MEDICATIONS: Vicodin 1 tablet every 6 hours p.r.n., clonazepam 0.5 mg daily, fluoxetine 20 mg a day, Lasix 20 mg b.i.d. gabapentin 200 mg b.i.d. methadone 10 mg 2 tablets b.i.d., Nexium 40 mg at bedtime, spironolactone 25 mg b.i.d.   FAMILY HISTORY: Positive for mother with heart disease. Father with prostate cancer.   SOCIAL HISTORY: The patient lives with her daughter, Fredderick Severance, phone number [phone]. I did contact Miss Karlene Einstein and told her the plans for endoscopy tomorrow.   REVIEW OF SYSTEMS PATIENT:   CARDIOVASCULAR: The patient denies chest pain.  GASTROINTESTINAL:  She cannot throw up because of her fundoplication.  She has had some nausea and rectal bleeding of dark and maroonish material.  GENITOURINARY: No dysuria or hematuria.  NEUROLOGIC: She has never had a stroke.   PHYSICAL EXAMINATION: GENERAL: An elderly white female in no acute distress. There is significant bruising on her right lower jaw.  HEENT: Sclerae anicteric. Tongue negative.  CHEST: Clear in the anterior fields.  HEART: Showed no murmurs or gallops I can hear.  ABDOMEN: Very obese, palpable liver, questionable palpable spleen.  EXTREMITIES: No significant edema.  NEUROLOGIC:  The patient is oriented and good historian.   LABORATORY, DIAGNOSTIC, AND RADIOLOGICAL DATA: CT of the abdomen and pelvis showed severe cirrhosis with splenomegaly and significant ascites, possible small gallstones present. Glucose 115, BUN 43, creatinine 1, sodium 139, potassium 5, chloride 108, bicarb 26, SGPT 26, SGOT 36. Hemoglobin 12.3 and platelet count 88. This was on admission. After 6 hours, her hemoglobin was 10.3, platelet count 56,000. Pro time is 16.2.   ASSESSMENT: Gastrointestinal bleeding after dental surgery with significant 2 unit drop and evidence of hypotension in the ER. She did receive some IV fentanyl in the ER, but the blood pressure was recorded as low about 30 minutes after that. She had passed a large amount of maroonish and darkish blood last night, according to the family   Given her history of cirrhosis and large varices, it is possible that she could have bled from these or from portal hypertensive gastropathy or painless ulcers.   PLAN:  1.  To do upper endoscopy tomorrow.  2.  Put her on octreotide drip tonight at 50 mcg an hour.  3.  Protonix 40 mg IV b.i.d.  4.  Empiric antibiotic coverage for Sepsis or SBP  with Levaquin    ____________________________ Manya Silvas, MD rte:dmm D: 11/19/2012 19:13:36 ET T: 11/19/2012 19:38:23 ET JOB#: [id number]  cc: Manya Silvas, MD, <Dictator> Manya Silvas MD ELECTRONICALLY  SIGNED 12/05/2012 8:09

## 2014-09-02 NOTE — Consult Note (Signed)
Chief Complaint:  Subjective/Chief Complaint The patient continues to have some abd pain. She is tolerating PO's well. No further signs of GI bleeding. She sasy she is getting stronger.   VITAL SIGNS/ANCILLARY NOTES: **Vital Signs.:   13-Jul-14 06:32  Vital Signs Type Routine  Temperature Temperature (F) 97.8  Celsius 36.5  Temperature Source oral  Pulse Pulse 72  Respirations Respirations 16  Systolic BP Systolic BP 456  Diastolic BP (mmHg) Diastolic BP (mmHg) 79  Mean BP 93  Pulse Ox % Pulse Ox % 98  Pulse Ox Activity Level  At rest  Oxygen Delivery Room Air/ 21 %   Brief Assessment:  GEN well developed, well nourished, no acute distress   Respiratory normal resp effort   Additional Physical Exam Alert and orientated times 3   Lab Results: Routine Hem:  13-Jul-14 04:31   Hemoglobin (CBC)  8.3 (Result(s) reported on 22 Nov 2012 at 05:18AM.)   Assessment/Plan:  Assessment/Plan:  Assessment GI bleed with cirrhosis.   Plan The patient is stable and tolerating PO's. Hb stable. Dr. Vira Agar will resume care of the patient tomorrow.   Electronic Signatures: Lucilla Lame (MD)  (Signed 13-Jul-14 08:31)  Authored: Chief Complaint, VITAL SIGNS/ANCILLARY NOTES, Brief Assessment, Lab Results, Assessment/Plan   Last Updated: 13-Jul-14 08:31 by Lucilla Lame (MD)

## 2014-09-07 ENCOUNTER — Ambulatory Visit
Admit: 2014-09-07 | Disposition: A | Discharge status: Home / Self Care | Payer: Self-pay | Attending: Gastroenterology | Admitting: Gastroenterology

## 2014-09-12 ENCOUNTER — Telehealth: Payer: Self-pay | Admitting: *Deleted

## 2014-09-12 NOTE — Telephone Encounter (Signed)
error 

## 2014-09-13 ENCOUNTER — Other Ambulatory Visit (HOSPITAL_COMMUNITY): Payer: Self-pay | Admitting: Interventional Radiology

## 2014-09-13 ENCOUNTER — Ambulatory Visit
Admission: RE | Admit: 2014-09-13 | Discharge: 2014-09-13 | Disposition: A | Discharge status: Home / Self Care | Payer: Medicare Other | Source: Ambulatory Visit | Attending: Gastroenterology | Admitting: Gastroenterology

## 2014-09-13 ENCOUNTER — Other Ambulatory Visit: Payer: Self-pay | Admitting: Radiology

## 2014-09-13 DIAGNOSIS — K7460 Unspecified cirrhosis of liver: Secondary | ICD-10-CM

## 2014-09-13 DIAGNOSIS — K7581 Nonalcoholic steatohepatitis (NASH): Secondary | ICD-10-CM

## 2014-09-13 DIAGNOSIS — R188 Other ascites: Principal | ICD-10-CM

## 2014-09-13 HISTORY — DX: Chronic obstructive pulmonary disease, unspecified: J44.9

## 2014-09-13 HISTORY — DX: Reserved for inherently not codable concepts without codable children: IMO0001

## 2014-09-13 HISTORY — DX: Malignant (primary) neoplasm, unspecified: C80.1

## 2014-09-14 ENCOUNTER — Ambulatory Visit: Admission: RE | Admit: 2014-09-14 | Payer: Self-pay | Source: Ambulatory Visit

## 2014-09-16 DIAGNOSIS — K7581 Nonalcoholic steatohepatitis (NASH): Secondary | ICD-10-CM | POA: Insufficient documentation

## 2014-09-16 DIAGNOSIS — K7460 Unspecified cirrhosis of liver: Secondary | ICD-10-CM | POA: Insufficient documentation

## 2014-09-16 NOTE — Consult Note (Signed)
Chief Complaint: Chief Complaint  Patient presents with  . Advice Only    TIPS Consult     Referring Physician(s): Oh,Paul Y  History of Present Illness: Susan May is a 81 y.o. female with a history of chronic nonalcoholic cirrhosis and refractory ascites. She has required nearly weekly large-volume paracentesis procedures since approximately July 2015. Susan first paracentesis was on 04/19/2013. Average volume of fluid removed at the time of paracentesis is approximately 5 L. She receives albumin infusions after paracentesis procedures. She does have a history of prior bleeding from gastroesophageal varices and portal gastropathy. She has not had a GI bleed for approximately 2 years.  The patient is quite functional and is able to perform activities of daily living independently. She has not had significant encephalopathy in the past.  Past Medical History  Diagnosis Date  . COPD (chronic obstructive pulmonary disease)   . Shortness of breath dyspnea   . Cancer     Hx: skin cancer x 2:  one melanoma      Past Surgical History  Procedure Laterality Date  . Appendectomy    . Vaginal hysterectomy    . Back surgery      Allergies: Review of patient's allergies indicates no known allergies.  Medications: Prior to Admission medications   Not on File    No family history on file.  History   Social History  . Marital Status: Widowed    Spouse Name: N/A  . Number of Children: N/A  . Years of Education: N/A   Social History Main Topics  . Smoking status: Former Smoker -- 0.50 packs/day    Types: Cigarettes    Start date: 09/12/1968    Quit date: 09/12/1989  . Smokeless tobacco: Never Used  . Alcohol Use: No  . Drug Use: Not on file  . Sexual Activity: Not on file   Other Topics Concern  . None   Social History Narrative  . None     Review of Systems: A 12 point ROS discussed and pertinent positives are indicated in the HPI above.  All other systems are  negative.  Review of Systems  Constitutional: Negative.   Respiratory: Negative.   Cardiovascular: Negative.   Gastrointestinal: Positive for abdominal distention. Negative for nausea, vomiting, abdominal pain, diarrhea and blood in stool.  Genitourinary: Negative.   Neurological: Negative.     Vital Signs: BP 122/58 mmHg  Pulse 55  Temp(Src) 97.6 F (36.4 C) (Oral)  Resp 14  Ht _0  (1.575 m)  Wt 140 lb (63.504 kg)  BMI 25.60 kg/m2  SpO2 100%  Physical Exam  Constitutional: She is oriented to person, place, and time. No distress.  Cardiovascular: Normal rate, regular rhythm and normal heart sounds.  Exam reveals no gallop and no friction rub.   No murmur heard. Pulmonary/Chest: Effort normal and breath sounds normal. No respiratory distress. She has no wheezes. She has no rales.  Abdominal: Soft. She exhibits distension. She exhibits no mass. There is no tenderness. There is no rebound and no guarding. No hernia.  Neurological: She is alert and oriented to person, place, and time.  Skin: She is not diaphoretic.  Nursing note and vitals reviewed.   Imaging: US Guided Paracentesis ([facility] Hx)  09/07/2014   INDICATION: Recurrent symptomatic ascites. Please perform ultrasound-guided paracentesis.  EXAM: ULTRASOUND-GUIDED PARACENTESIS  COMPARISON:  Multiple prior ultrasound-guided paracenteses.  MEDICATIONS: None.  COMPLICATIONS: None immediate  TECHNIQUE: Informed written consent was obtained from the patient after  a discussion of the risks, benefits and alternatives to treatment. A timeout was performed prior to the initiation of the procedure.  Initial ultrasound scanning demonstrates a moderate amount of ascites within the right lower abdominal quadrant. The right lower abdomen was prepped and draped in the usual sterile fashion. 1% lidocaine with epinephrine was used for local anesthesia. An ultrasound image was saved for documentation purposed. An 8 Fr Safe-T-Centesis catheter was  introduced. The paracentesis was performed. The catheter was removed and a dressing was applied. The patient tolerated the procedure well without immediate post procedural complication.  FINDINGS: A total of approximately 5.25 liters of serous fluid was removed.  IMPRESSION: Successful ultrasound-guided paracentesis yielding 5.25 liters of peritoneal fluid.   Electronically Signed   By: Sandi Mariscal M.D.   On: 09/07/2014 12:47   US Guided Paracentesis ([facility] Hx)  08/31/2014   CLINICAL DATA:  Cirrhosis, portal venous hypertension, recurrent large volume ascites  EXAM: ULTRASOUND GUIDED PARACENTESIS  TECHNIQUE: The procedure, risks (including but not limited to bleeding, infection, organ damage ), benefits, and alternatives were explained to the patient. Questions regarding the procedure were encouraged and answered. The patient understands and consents to the procedure. Survey ultrasound of the abdomen was performed and an appropriate skin entry site in the right lateral abdomen was selected. Skin site was marked, prepped with Betadine, and draped in usual sterile fashion, and infiltrated locally with 1% lidocaine. A Safe-T-Centesis needle was advanced into the peritoneal space until fluid could be aspirated. The sheath was advanced and the needle removed. 4.7 L of clear yellowascites were aspirated. The patient tolerated the procedure well.  COMPLICATIONS: COMPLICATIONS none  IMPRESSION: Technically successful ultrasound guided paracentesis, removing 4.7 L of ascites.   Electronically Signed   By: Lucrezia Europe M.D.   On: 08/31/2014 12:48    Labs:  CBC:  Recent Labs  11/05/13 0814 11/26/13 1710  WBC 3.8 4.0  HGB 12.4 12.6  HCT 37.1 38.6  PLT 69* 84*    COAGS:  Recent Labs  11/05/13 0814  INR 1.3  APTT 33.1    BMP:  Recent Labs  11/26/13 1710 04/22/14 1335  NA 128*  --   K 4.1  --   CL 94*  --   CO2 30  --   GLUCOSE 115*  --   BUN 13  --   CALCIUM 8.3*  --   CREATININE 1.39* 1.53*   GFRNONAA 36*  --   GFRAA 41*  --     LIVER FUNCTION TESTS:  Recent Labs  11/26/13 1710  AST 34  ALT 20  ALKPHOS 103  PROT 5.9*  ALBUMIN 3.1*    TUMOR MARKERS: No results for input(s): AFPTM, CEA, CA199, CHROMGRNA in the last 8760 hours.  Assessment and Plan:  I met with Susan May and Susan May. Based on available laboratory tests from 2015, the patient's MELD score is 14. Ammonia level was mildly elevated in the past at 40. Most recent platelet count is 84. Some renal insufficiency has been present in the past with creatinine as high as 1.53 and estimated GFR of 35 mL per minute.  Imaging was also reviewed with a CT on 05/17/2013 demonstrating a shrunken and cirrhotic liver. 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 No evidence of cavernous transformation or spinal vein thrombosis. Another CT was performed on 04/22/2014 which did not demonstrate any progression of  portal vein thrombosis and less prominent intrahepatic left-sided portal vein thrombus. The umbilical vein is recanalized. There has been no evidence by imaging of hepatocellular carcinoma.  I discussed options for treatment of refractory ascites with Susan May including continued periodic large-volume paracentesis procedures and also placement of a TIPS shunt.  Given the repeated number of chronic paracentesis procedures, the patient is fairly well compensated clinically. She does have some chronic decreased appetite related to chronic abdominal distention and also states that she gets short of breath when lying flat.  I told Susan May and Susan May that my current concern about proceeding immediately with TIPS relate primarily to Susan age and the previous presence of chronic appearing nonocclusive mural thrombus in the portal vein. I recommended that we proceed with a hepatic duplex ultrasound study  to evaluate patency of the main portal vein and intrahepatic portal branches. Based on findings, we may also have to proceed with a contrast-enhanced CT to further delineate portal patency and any presence of new portosystemic shunts.  I would also like to draw a more recent set of laboratory tests including CBC, CMP, INR and ammonia level to recalculate MELD score and obtain more up-to-date laboratory values. Once tests are completed, I will re-meet with Susan May and Susan May to discuss potential TIPS to treat refractory ascites. We did discuss the procedure today including indications and potential risks. The primary risks would be bleeding, development of more significant encephalopathy and worsening hepatic dysfunction after TIPS. There is an indication to place a shunt to treat refractory ascites.  Thank you for this interesting consult.  I greatly enjoyed meeting Susan May and look forward to participating in their care.  SignedAletta Edouard T 09/16/2014, 4:05 PM   I spent a total of 45 minutes face to face in clinical consultation, greater than 50% of which was counseling/coordinating care for refractory ascites and portal hypertension.

## 2014-09-21 ENCOUNTER — Ambulatory Visit: Admission: RE | Admit: 2014-09-21 | Payer: Medicare Other | Source: Ambulatory Visit

## 2014-09-21 ENCOUNTER — Other Ambulatory Visit: Payer: Self-pay | Admitting: Interventional Radiology

## 2014-09-21 ENCOUNTER — Other Ambulatory Visit (HOSPITAL_COMMUNITY): Payer: Self-pay | Admitting: Interventional Radiology

## 2014-09-21 ENCOUNTER — Ambulatory Visit: Payer: Medicare Other

## 2014-09-21 ENCOUNTER — Ambulatory Visit
Admission: RE | Admit: 2014-09-21 | Discharge: 2014-09-21 | Disposition: A | Discharge status: Home / Self Care | Payer: Medicare Other | Source: Ambulatory Visit | Attending: Gastroenterology | Admitting: Gastroenterology

## 2014-09-21 ENCOUNTER — Telehealth: Payer: Self-pay | Admitting: *Deleted

## 2014-09-21 DIAGNOSIS — Z95828 Presence of other vascular implants and grafts: Secondary | ICD-10-CM

## 2014-09-21 DIAGNOSIS — K7460 Unspecified cirrhosis of liver: Secondary | ICD-10-CM

## 2014-09-21 DIAGNOSIS — K828 Other specified diseases of gallbladder: Secondary | ICD-10-CM | POA: Diagnosis not present

## 2014-09-21 DIAGNOSIS — R188 Other ascites: Principal | ICD-10-CM

## 2014-09-21 DIAGNOSIS — K8020 Calculus of gallbladder without cholecystitis without obstruction: Secondary | ICD-10-CM | POA: Diagnosis not present

## 2014-09-21 DIAGNOSIS — R161 Splenomegaly, not elsewhere classified: Secondary | ICD-10-CM | POA: Insufficient documentation

## 2014-09-21 LAB — PROTIME-INR
INR: 1.28
Prothrombin Time: 16.2 seconds — ABNORMAL HIGH (ref 11.4–15.0)

## 2014-09-21 LAB — CBC
HEMATOCRIT: 31 % — AB (ref 35.0–47.0)
Hemoglobin: 10.6 g/dL — ABNORMAL LOW (ref 12.0–16.0)
MCH: 33.6 pg (ref 26.0–34.0)
MCHC: 34 g/dL (ref 32.0–36.0)
MCV: 98.7 fL (ref 80.0–100.0)
Platelets: 45 10*3/uL — ABNORMAL LOW (ref 150–440)
RBC: 3.14 MIL/uL — ABNORMAL LOW (ref 3.80–5.20)
RDW: 14.7 % — ABNORMAL HIGH (ref 11.5–14.5)
WBC: 2.9 10*3/uL — AB (ref 3.6–11.0)

## 2014-09-21 LAB — BASIC METABOLIC PANEL
ANION GAP: 6 (ref 5–15)
BUN: 24 mg/dL — ABNORMAL HIGH (ref 6–20)
CHLORIDE: 99 mmol/L — AB (ref 101–111)
CO2: 30 mmol/L (ref 22–32)
CREATININE: 1.61 mg/dL — AB (ref 0.44–1.00)
Calcium: 8.8 mg/dL — ABNORMAL LOW (ref 8.9–10.3)
GFR, EST AFRICAN AMERICAN: 33 mL/min — AB (ref >60)
GFR, EST NON AFRICAN AMERICAN: 29 mL/min — AB (ref >60)
Glucose, Bld: 120 mg/dL — ABNORMAL HIGH (ref 65–99)
Potassium: 3.8 mmol/L (ref 3.5–5.1)
Sodium: 135 mmol/L (ref 135–145)

## 2014-09-21 LAB — AMMONIA: Ammonia: 23 umol/L (ref 9–35)

## 2014-09-21 LAB — APTT: APTT: 32 s (ref 24–36)

## 2014-09-21 MED ORDER — ALBUMIN HUMAN 25 % IV SOLN
12.5000 g | Freq: Once | INTRAVENOUS | Status: AC
  Administered 2014-09-21: 25 g via INTRAVENOUS
  Filled 2014-09-21: qty 50

## 2014-09-21 MED ORDER — ALBUMIN HUMAN 25 % IV SOLN
12.5000 g | Freq: Once | INTRAVENOUS | Status: AC
  Administered 2014-09-21: 12.5 g via INTRAVENOUS
  Filled 2014-09-21: qty 50

## 2014-09-21 NOTE — Progress Notes (Signed)
Chaplain met with patient, provided education on AD, completed AD, provided copies, placed copy in patient chart. Loralyn Freshwater D. Alroy Dust Wednesday 09-21-2014   09/21/14 1022  Clinical Encounter Type  Visited With Patient;Health care provider  Visit Type Code (Chaplain paged to special recovery for Advance Directive)  Referral From Nurse (paged chaplain do Advance Directive)  Consult/Referral To Chaplain;Nurse  Spiritual Encounters  Spiritual Needs Literature;Prayer;Emotional (patient request complete AD before her procedure)  Stress Factors  Patient Stress Factors Health changes  Family Stress Factors Health changes  Advance Directives (For Healthcare)  Does patient have an advance directive? Yes  Would patient like information on creating an advanced directive? Yes - Scientist, clinical (histocompatibility and immunogenetics) given  Type of Paramedic of [place];Living will  Copy of advanced directive(s) in chart? (copy it chart, copies given to patient, copies for POA)

## 2014-09-21 NOTE — Discharge Instructions (Signed)
Ascites °Ascites is a gathering of fluid in the belly (abdomen). This is most often caused by liver disease. It may also be caused by a number of other less common problems. It causes a ballooning out (distension) of the abdomen. °CAUSES  °Scarring of the liver (cirrhosis) is the most common cause of ascites. Other causes include: °· Infection or inflammation in the abdomen. °· Cancer in the abdomen. °· Heart failure. °· Certain forms of kidney failure (nephritic syndrome). °· Inflammation of the pancreas. °· Clots in the veins of the liver. °SYMPTOMS  °In the early stages of ascites, you may not have any symptoms. The main symptom of ascites is a sense of abdominal bloating. This is due to the presence of fluid. This may also cause an increase in abdominal or waist size. People with this condition can develop swelling in the legs, and men can develop a swollen scrotum. When there is a lot of fluid, it may be hard to breath. Stretching of the abdomen by fluid can be painful. °DIAGNOSIS  °Certain features of your medical history, such as a history of liver disease and of an enlarging abdomen, can suggest the presence of ascites. The diagnosis of ascites can be made on physical exam by your caregiver. An abdominal ultrasound examination can confirm that ascites is present, and estimate the amount of fluid. °Once ascites is confirmed, it is important to determine its cause. Again, a history of one of the conditions listed in "CAUSES" provides a strong clue. A physical exam is important, and blood and X-ray tests may be needed. During a procedure called paracentesis, a sample of fluid is removed from the abdomen. This can determine certain key features about the fluid, such as whether or not infection or cancer is present. Your caregiver will determine if a paracentesis is necessary. They will describe the procedure to you. °PREVENTION  °Ascites is a complication of other conditions. Therefore to prevent ascites, you  must seek treatment for any significant health conditions you have. Once ascites is present, careful attention to fluid and salt intake may help prevent it from getting worse. If you have ascites, you should not drink alcohol. °PROGNOSIS  °The prognosis of ascites depends on the underlying disease. If the disease is reversible, such as with certain infections or with heart failure, then ascites may improve or disappear. When ascites is caused by cirrhosis, then it indicates that the liver disease has worsened, and further evaluation and treatment of the liver disease is needed. If your ascites is caused by cancer, then the success or failure of the cancer treatment will determine whether your ascites will improve or worsen. °RISKS AND COMPLICATIONS  °Ascites is likely to worsen if it is not properly diagnosed and treated. A large amount of ascites can cause pain and difficulty breathing. The main complication, besides worsening, is infection (called spontaneous bacterial peritonitis). This requires prompt treatment. °TREATMENT  °The treatment of ascites depends on its cause. When liver disease is your cause, medical management using water pills (diuretics) and decreasing salt intake is often effective. Ascites due to peritoneal inflammation or malignancy (cancer) alone does not respond to salt restriction and diuretics. Hospitalization is sometimes required. °If the treatment of ascites cannot be managed with medications, a number of other treatments are available. Your caregivers will help you decide which will work best for you. Some of these are: °· Removal of fluid from the abdomen (paracentesis). °· Fluid from the abdomen is passed into a vein (peritoneovenous shunting). °·   Liver transplantation. °· Transjugular intrahepatic portosystemic stent shunt. °HOME CARE INSTRUCTIONS  °It is important to monitor body weight and the intake and output of fluids. Weigh yourself at the same time every day. Record your  weights. Fluid restriction may be necessary. It is also important to know your salt intake. The more salt you take in, the more fluid you will retain. Ninety percent of people with ascites respond to this approach. °· Follow any directions for medicines carefully. °· Follow up with your caregiver, as directed. °· Report any changes in your health, especially any new or worsening symptoms. °· If your ascites is from liver disease, avoid alcohol and other substances toxic to the liver. °SEEK MEDICAL CARE IF:  °· Your weight increases more than a few pounds in a few days. °· Your abdominal or waist size increases. °· You develop swelling in your legs. °· You had swelling and it worsens. °SEEK IMMEDIATE MEDICAL CARE IF:  °· You develop a fever. °· You develop new abdominal pain. °· You develop difficulty breathing. °· You develop confusion. °· You have bleeding from the mouth, stomach, or rectum. °MAKE SURE YOU:  °· Understand these instructions. °· Will watch your condition. °· Will get help right away if you are not doing well or get worse. °Document Released: 04/29/2005 Document Revised: 07/22/2011 Document Reviewed: 11/28/2006 °[employer]® Patient Information ©2015 [employer]. This information is not intended to replace advice given to you by your health care provider. Make sure you discuss any questions you have with your health care provider. ° °

## 2014-09-21 NOTE — Procedures (Signed)
Interventional Radiology Procedure Note  Procedure: US guided paracentesis.   Complications: None Recommendations:    - Routine care   Signed,  Carinne Brandenburger S. Octavia Mottola, DO    

## 2014-09-27 ENCOUNTER — Other Ambulatory Visit: Payer: Self-pay | Admitting: Radiology

## 2014-09-28 ENCOUNTER — Other Ambulatory Visit: Payer: Self-pay | Admitting: Gastroenterology

## 2014-09-28 ENCOUNTER — Ambulatory Visit
Admission: RE | Admit: 2014-09-28 | Discharge: 2014-09-28 | Disposition: A | Discharge status: Home / Self Care | Payer: Medicare Other | Source: Ambulatory Visit | Attending: Gastroenterology | Admitting: Gastroenterology

## 2014-09-28 DIAGNOSIS — R188 Other ascites: Secondary | ICD-10-CM | POA: Insufficient documentation

## 2014-09-28 SURGERY — Surgical Case
Anesthesia: *Unknown

## 2014-09-28 MED ORDER — ALBUMIN HUMAN 25 % IV SOLN
25.0000 g | Freq: Once | INTRAVENOUS | Status: AC
  Administered 2014-09-28: 25 g via INTRAVENOUS
  Filled 2014-09-28: qty 100

## 2014-09-28 MED ORDER — ALBUMIN HUMAN 25 % IV SOLN
12.5000 g | Freq: Once | INTRAVENOUS | Status: AC
  Administered 2014-09-28: 12.5 g via INTRAVENOUS
  Filled 2014-09-28: qty 50

## 2014-10-04 ENCOUNTER — Other Ambulatory Visit: Payer: Self-pay | Admitting: Radiology

## 2014-10-05 ENCOUNTER — Ambulatory Visit
Admission: RE | Admit: 2014-10-05 | Discharge: 2014-10-05 | Disposition: A | Discharge status: Home / Self Care | Payer: Medicare Other | Source: Ambulatory Visit | Attending: Gastroenterology | Admitting: Gastroenterology

## 2014-10-05 DIAGNOSIS — R188 Other ascites: Secondary | ICD-10-CM | POA: Diagnosis not present

## 2014-10-05 MED ORDER — ALBUMIN HUMAN 25 % IV SOLN
25.0000 g | Freq: Once | INTRAVENOUS | Status: AC
  Administered 2014-10-05: 25 g via INTRAVENOUS
  Filled 2014-10-05 (×2): qty 100

## 2014-10-05 MED ORDER — ALBUMIN HUMAN 25 % IV SOLN
12.5000 g | Freq: Once | INTRAVENOUS | Status: AC
  Administered 2014-10-05: 12.5 g via INTRAVENOUS
  Filled 2014-10-05: qty 50

## 2014-10-05 NOTE — Procedures (Signed)
Interventional Radiology Procedure Note  Procedure: US guided paracentesis  Complications:  None Recommendations:    - Routine care   Full report in canopy pacs

## 2014-10-11 ENCOUNTER — Other Ambulatory Visit: Payer: Self-pay | Admitting: Radiology

## 2014-10-12 ENCOUNTER — Ambulatory Visit
Admission: RE | Admit: 2014-10-12 | Discharge: 2014-10-12 | Disposition: A | Discharge status: Home / Self Care | Payer: Medicare Other | Source: Ambulatory Visit | Attending: Gastroenterology | Admitting: Gastroenterology

## 2014-10-12 DIAGNOSIS — R188 Other ascites: Secondary | ICD-10-CM | POA: Diagnosis present

## 2014-10-12 MED ORDER — ALBUMIN HUMAN 25 % IV SOLN
25.0000 g | Freq: Once | INTRAVENOUS | Status: AC
  Administered 2014-10-12: 25 g via INTRAVENOUS
  Filled 2014-10-12: qty 100

## 2014-10-12 NOTE — Procedures (Signed)
US guided paracentesis.  Removed 2.75 liters of fluid.  No immediate complication and minimal blood loss.

## 2014-10-18 ENCOUNTER — Other Ambulatory Visit: Payer: Self-pay | Admitting: Radiology

## 2014-10-19 ENCOUNTER — Ambulatory Visit
Admission: RE | Admit: 2014-10-19 | Discharge: 2014-10-19 | Disposition: A | Discharge status: Home / Self Care | Payer: Medicare Other | Source: Ambulatory Visit | Attending: Gastroenterology | Admitting: Gastroenterology

## 2014-10-19 MED ORDER — ALBUMIN HUMAN 25 % IV SOLN
25.0000 g | Freq: Once | INTRAVENOUS | Status: DC
  Filled 2014-10-19: qty 100

## 2014-10-25 ENCOUNTER — Other Ambulatory Visit: Payer: Self-pay | Admitting: Radiology

## 2014-10-26 ENCOUNTER — Ambulatory Visit
Admission: RE | Admit: 2014-10-26 | Discharge: 2014-10-26 | Disposition: A | Discharge status: Home / Self Care | Payer: Medicare Other | Source: Ambulatory Visit | Attending: Gastroenterology | Admitting: Gastroenterology

## 2014-10-26 DIAGNOSIS — R188 Other ascites: Secondary | ICD-10-CM | POA: Diagnosis not present

## 2014-10-26 MED ORDER — ALBUMIN HUMAN 25 % IV SOLN
25.0000 g | Freq: Once | INTRAVENOUS | Status: AC
  Administered 2014-10-26: 25 g via INTRAVENOUS
  Filled 2014-10-26: qty 100

## 2014-10-26 NOTE — Progress Notes (Signed)
Patient ID: Susan May, female   DOB: April 08, 1932, 81 y.o.   MRN: [id number] Paracentesis  Complications:  None  Blood Loss: none  See dictation in canopy pacs

## 2014-11-01 ENCOUNTER — Other Ambulatory Visit: Payer: Self-pay | Admitting: Radiology

## 2014-11-02 ENCOUNTER — Ambulatory Visit
Admission: RE | Admit: 2014-11-02 | Discharge: 2014-11-02 | Disposition: A | Discharge status: Home / Self Care | Payer: Medicare Other | Source: Ambulatory Visit | Attending: Gastroenterology | Admitting: Gastroenterology

## 2014-11-02 DIAGNOSIS — R188 Other ascites: Secondary | ICD-10-CM | POA: Diagnosis present

## 2014-11-02 MED ORDER — ALBUMIN HUMAN 25 % IV SOLN
25.0000 g | Freq: Once | INTRAVENOUS | Status: AC
  Administered 2014-11-02: 25 g via INTRAVENOUS
  Filled 2014-11-02: qty 100

## 2014-11-02 MED ORDER — ALBUMIN HUMAN 25 % IV SOLN
25.0000 g | Freq: Once | INTRAVENOUS | Status: AC
Start: 2014-11-02 — End: 2014-11-02
  Administered 2014-11-02: 25 g via INTRAVENOUS
  Filled 2014-11-02: qty 100

## 2014-11-02 NOTE — Procedures (Signed)
Successful [place] large volume paracentesis No comp Stable Full report in pacs

## 2014-11-08 ENCOUNTER — Other Ambulatory Visit: Payer: Self-pay | Admitting: Radiology

## 2014-11-09 ENCOUNTER — Ambulatory Visit
Admission: RE | Admit: 2014-11-09 | Discharge: 2014-11-09 | Disposition: A | Discharge status: Home / Self Care | Payer: Medicare Other | Source: Ambulatory Visit | Attending: Gastroenterology | Admitting: Gastroenterology

## 2014-11-09 DIAGNOSIS — R188 Other ascites: Secondary | ICD-10-CM | POA: Insufficient documentation

## 2014-11-09 MED ORDER — ALBUMIN HUMAN 25 % IV SOLN
25.0000 g | Freq: Once | INTRAVENOUS | Status: AC
  Administered 2014-11-09: 25 g via INTRAVENOUS
  Filled 2014-11-09: qty 100

## 2014-11-09 MED ORDER — ALBUMIN HUMAN 25 % IV SOLN
12.5000 g | Freq: Once | INTRAVENOUS | Status: AC
  Administered 2014-11-09: 12.5 g via INTRAVENOUS
  Filled 2014-11-09: qty 50

## 2014-11-09 NOTE — Procedures (Signed)
Under US guidance, paracentesis was performed. No immediate complication.

## 2014-11-15 ENCOUNTER — Other Ambulatory Visit: Payer: Self-pay | Admitting: Gastroenterology

## 2014-11-15 DIAGNOSIS — R188 Other ascites: Secondary | ICD-10-CM

## 2014-11-16 ENCOUNTER — Other Ambulatory Visit: Payer: Self-pay | Admitting: Gastroenterology

## 2014-11-16 ENCOUNTER — Ambulatory Visit
Admission: RE | Admit: 2014-11-16 | Discharge: 2014-11-16 | Disposition: A | Discharge status: Home / Self Care | Payer: Medicare Other | Source: Ambulatory Visit | Attending: Gastroenterology | Admitting: Gastroenterology

## 2014-11-16 DIAGNOSIS — K7581 Nonalcoholic steatohepatitis (NASH): Secondary | ICD-10-CM | POA: Diagnosis not present

## 2014-11-16 DIAGNOSIS — J449 Chronic obstructive pulmonary disease, unspecified: Secondary | ICD-10-CM | POA: Diagnosis not present

## 2014-11-16 DIAGNOSIS — K7460 Unspecified cirrhosis of liver: Secondary | ICD-10-CM | POA: Diagnosis not present

## 2014-11-16 DIAGNOSIS — Z85828 Personal history of other malignant neoplasm of skin: Secondary | ICD-10-CM | POA: Insufficient documentation

## 2014-11-16 DIAGNOSIS — R188 Other ascites: Secondary | ICD-10-CM | POA: Diagnosis not present

## 2014-11-16 MED ORDER — ALBUMIN HUMAN 25 % IV SOLN
25.0000 g | Freq: Once | INTRAVENOUS | Status: AC
  Administered 2014-11-16: 25 g via INTRAVENOUS
  Filled 2014-11-16 (×2): qty 100

## 2014-11-16 MED ORDER — ALBUMIN HUMAN 25 % IV SOLN
25.0000 g | Freq: Once | INTRAVENOUS | Status: AC
  Administered 2014-11-16 (×2): 25 g via INTRAVENOUS
  Filled 2014-11-16: qty 100

## 2014-11-16 NOTE — Procedures (Signed)
Informed consent obtained. Under US guidance, paracentesis was obtained.

## 2014-11-22 ENCOUNTER — Other Ambulatory Visit: Payer: Self-pay | Admitting: Radiology

## 2014-11-23 ENCOUNTER — Ambulatory Visit
Admission: RE | Admit: 2014-11-23 | Discharge: 2014-11-23 | Disposition: A | Discharge status: Home / Self Care | Payer: Medicare Other | Source: Ambulatory Visit | Attending: Gastroenterology | Admitting: Gastroenterology

## 2014-11-23 DIAGNOSIS — R188 Other ascites: Secondary | ICD-10-CM | POA: Insufficient documentation

## 2014-11-23 MED ORDER — ALBUMIN HUMAN 25 % IV SOLN
25.0000 g | Freq: Once | INTRAVENOUS | Status: DC
  Filled 2014-11-23: qty 100

## 2014-11-23 MED ORDER — ALBUMIN HUMAN 25 % IV SOLN
12.5000 g | Freq: Once | INTRAVENOUS | Status: DC
  Filled 2014-11-23: qty 50

## 2014-11-23 NOTE — Procedures (Signed)
Under US guidance, paracentesis was performed. No immediate complication.

## 2014-11-23 NOTE — Progress Notes (Signed)
Patient ID: Susan May, female   DOB: 1931-08-15, 81 y.o.   MRN: [id number]    Chief Complaint: Update H+P for paracentesis.  Referring Physician(s): Oh,Paul Y  History of Present Illness: Susan May is a 81 y.o. female with recurrent ascites. No interval change in medical history since last H+P.  Past Medical History  Diagnosis Date  . COPD (chronic obstructive pulmonary disease)   . Shortness of breath dyspnea   . Cancer     Hx: skin cancer x 2:  one melanoma      Past Surgical History  Procedure Laterality Date  . Appendectomy    . Vaginal hysterectomy    . Back surgery      Allergies: Review of patient's allergies indicates no known allergies.  Medications: Prior to Admission medications   Medication Sig Start Date End Date Taking? Authorizing Provider  escitalopram (LEXAPRO) 10 MG tablet Take 10 mg by mouth daily.    Historical Provider, MD  esomeprazole (NEXIUM) 20 MG capsule Take 20 mg by mouth daily at 12 noon.    Historical Provider, MD  FentaNYL (DURAGESIC-12 TD) Place onto the skin every 3 (three) days.    Historical Provider, MD  furosemide (LASIX) 40 MG tablet Take 40 mg by mouth 2 (two) times daily.    Historical Provider, MD  lactulose (CHRONULAC) 10 GM/15ML solution Take by mouth daily.    Historical Provider, MD  metoCLOPramide (REGLAN) 5 MG tablet Take 5 mg by mouth 4 (four) times daily.    Historical Provider, MD  nadolol (CORGARD) 20 MG tablet Take 20 mg by mouth daily.    Historical Provider, MD  ondansetron (ZOFRAN-ODT) 4 MG disintegrating tablet Take 4 mg by mouth every 8 (eight) hours as needed for nausea or vomiting.    Historical Provider, MD  rifaximin (XIFAXAN) 550 MG TABS tablet Take 550 mg by mouth 2 (two) times daily.    Historical Provider, MD  spironolactone (ALDACTONE) 25 MG tablet Take 25 mg by mouth 2 (two) times daily.    Historical Provider, MD     History reviewed. No pertinent family history.  History   Social History  .  Marital Status: Widowed    Spouse Name: N/A  . Number of Children: N/A  . Years of Education: N/A   Social History Main Topics  . Smoking status: Former Smoker -- 0.50 packs/day    Types: Cigarettes    Start date: 09/12/1968    Quit date: 09/12/1989  . Smokeless tobacco: Never Used  . Alcohol Use: No  . Drug Use: Not on file  . Sexual Activity: Not on file   Other Topics Concern  . None   Social History Narrative    ECOG Status: 1 - Symptomatic but completely ambulatory  Review of Systems: A 12 point ROS discussed and pertinent positives are indicated in the HPI above.  All other systems are negative.  Review of Systems   Vital Signs: BP 139/62 mmHg  Pulse 52  Temp(Src) 97.8 F (36.6 C)  Resp 18  SpO2 99%  Physical Exam  Mallampati Score:     Imaging: US Paracentesis  11/16/2014   CLINICAL DATA:  Ascites.  EXAM: ULTRASOUND GUIDED PARACENTESIS  COMPARISON:  November 09, 2014.  PROCEDURE: An ultrasound guided paracentesis was thoroughly discussed with the patient and questions answered. The benefits, risks, alternatives and complications were also discussed. The patient understands and wishes to proceed with the procedure. Written consent was obtained.  Ultrasound was performed to  localize and mark an adequate pocket of fluid in the right lower quadrant of the abdomen. The area was then prepped and draped in the normal sterile fashion. 1% Lidocaine was used for local anesthesia. Under ultrasound guidance a Safe-T-Centesis catheter was introduced. Paracentesis was performed. The catheter was removed and a dressing applied.  COMPLICATIONS: None immediate.  FINDINGS: A total of approximately 5 L of serous fluid was removed. A fluid sample was not sent for laboratory analysis.  IMPRESSION: Successful ultrasound guided paracentesis yielding 5 L of ascites.   Electronically Signed   By: Marijo Conception, M.D.   On: 11/16/2014 12:40   US Paracentesis  11/09/2014   CLINICAL DATA:   Ascites.  EXAM: ULTRASOUND GUIDED PARACENTESIS  COMPARISON:  November 02, 2014.  PROCEDURE: An ultrasound guided paracentesis was thoroughly discussed with the patient and questions answered. The benefits, risks, alternatives and complications were also discussed. The patient understands and wishes to proceed with the procedure. Written consent was obtained.  Ultrasound was performed to localize and mark an adequate pocket of fluid in the left lower quadrant of the abdomen. The area was then prepped and draped in the normal sterile fashion. 1% Lidocaine was used for local anesthesia. Under ultrasound guidance a Safe-T-Centesis catheter was introduced. Paracentesis was performed. The catheter was removed and a dressing applied.  COMPLICATIONS: None immediate.  FINDINGS: A total of approximately 4.2 L of serous fluid was removed. A fluid sample was not sent for laboratory analysis.  IMPRESSION: Successful ultrasound guided paracentesis yielding 4.2 L of ascites.   Electronically Signed   By: Marijo Conception, M.D.   On: 11/09/2014 13:11   US Paracentesis  11/02/2014   CLINICAL DATA:  Recurrent abdominal ascites, cirrhosis, [place]  EXAM: ULTRASOUND GUIDED PARACENTESIS  COMPARISON:  10/26/2014  PROCEDURE: An ultrasound guided paracentesis was thoroughly discussed with the patient and questions answered. The benefits, risks, alternatives and complications were also discussed. The patient understands and wishes to proceed with the procedure. Written consent was obtained.  Ultrasound was performed to localize and mark an adequate pocket of fluid in the left lower quadrant of the abdomen. The area was then prepped and draped in the normal sterile fashion. 1% Lidocaine was used for local anesthesia. Under ultrasound guidance a safety centesis needle catheter was introduced. Paracentesis was performed. The catheter was removed and a dressing applied.  COMPLICATIONS: None immediate  FINDINGS: A total of approximately 5 L of clear  peritoneal fluid was removed. A fluid sample was not sent for laboratory analysis.  IMPRESSION: Successful ultrasound guided paracentesis yielding 5 L of ascites.   Electronically Signed   By: Jerilynn Mages.  Shick M.D.   On: 11/02/2014 13:13   US Paracentesis  10/26/2014   CLINICAL DATA:  Ascites  EXAM: ULTRASOUND GUIDED PARACENTESIS  PROCEDURE: An ultrasound guided paracentesis was thoroughly discussed with the patient and questions answered. The benefits, risks, alternatives and complications were also discussed. The patient understands and wishes to proceed with the procedure. Written consent was obtained.  Ultrasound was performed to localize and mark an adequate pocket of fluid in the right upper quadrant of the abdomen. The area was then prepped and draped in the normal sterile fashion. 1% Lidocaine was used for local anesthesia. Under ultrasound guidance, a 6 French paracentesis catheter was introduced. Paracentesis was performed. The catheter was removed and a dressing applied.  COMPLICATIONS: None immediate  FINDINGS: A total of approximately 6.1 L of clear yellow fluid was removed. A fluid sample was  notsent for laboratory analysis.  IMPRESSION: Successful ultrasound guided paracentesis yielding 6.1 L of ascites.   Electronically Signed   By: Inez Catalina M.D.   On: 10/26/2014 12:56    Labs:  CBC:  Recent Labs  11/26/13 1710 09/21/14 1033  WBC 4.0 2.9*  HGB 12.6 10.6*  HCT 38.6 31.0*  PLT 84* 45*    COAGS:  Recent Labs  09/21/14 1033  INR 1.28  APTT 32    BMP:  Recent Labs  11/26/13 1710 04/22/14 1335 09/21/14 1033  NA 128*  --  135  K 4.1  --  3.8  CL 94*  --  99*  CO2 30  --  30  GLUCOSE 115*  --  120*  BUN 13  --  24*  CALCIUM 8.3*  --  8.8*  CREATININE 1.39* 1.53* 1.61*  GFRNONAA 36*  --  29*  GFRAA 41*  --  33*    LIVER FUNCTION TESTS:  Recent Labs  11/26/13 1710  AST 34  ALT 20  ALKPHOS 103  PROT 5.9*  ALBUMIN 3.1*    TUMOR MARKERS: No results for  input(s): AFPTM, CEA, CA199, CHROMGRNA in the last 8760 hours.  Assessment and Plan:  Will perform paracentesis.  Thank you for this interesting consult.  I greatly enjoyed meeting Susan May and look forward to participating in their care.  SignedSabino Dick 11/23/2014, 11:10 AM

## 2014-11-29 ENCOUNTER — Other Ambulatory Visit: Payer: Self-pay | Admitting: Radiology

## 2014-11-30 ENCOUNTER — Ambulatory Visit
Admission: RE | Admit: 2014-11-30 | Discharge: 2014-11-30 | Disposition: A | Discharge status: Home / Self Care | Payer: Medicare Other | Source: Ambulatory Visit | Attending: Gastroenterology | Admitting: Gastroenterology

## 2014-11-30 DIAGNOSIS — R188 Other ascites: Secondary | ICD-10-CM | POA: Insufficient documentation

## 2014-11-30 MED ORDER — ALBUMIN HUMAN 25 % IV SOLN
12.5000 g | Freq: Once | INTRAVENOUS | Status: AC
  Administered 2014-11-30: 12.5 g via INTRAVENOUS
  Filled 2014-11-30: qty 50

## 2014-11-30 MED ORDER — ALBUMIN HUMAN 25 % IV SOLN
25.0000 g | Freq: Once | INTRAVENOUS | Status: AC
  Administered 2014-11-30: 25 g via INTRAVENOUS
  Filled 2014-11-30: qty 100

## 2014-11-30 MED ORDER — ALBUMIN HUMAN 25 % IV SOLN: 12.5000 g | Freq: Once | INTRAVENOUS | Status: DC

## 2014-11-30 NOTE — Discharge Instructions (Signed)
Paracentesis °Paracentesis is a procedure used to remove excess fluid from the belly (abdomen). Excess fluid in the belly is called ascites. Excess fluid can be the result of certain conditions, such as infection, inflammation, abdominal injury, heart failure, chronic scarring of the liver (cirrhosis), or cancer. The excess fluid is removed using a needle inserted through the skin and tissue into the abdomen.  °A paracentesis may be done to: °· Determine the cause of the excess fluid through examination of the fluid. °· Relieve symptoms of shortness of breath or pain caused by the excess fluid. °· Determine presence of bleeding after an abdominal injury. °LET YOUR CAREGIVERS KNOW ABOUT: °· Allergies. °· Medications taken including herbs, eye drops, over-the-counter medications, and creams. °· Use of steroids (by mouth or creams). °· Previous problems with anesthetics or numbing medicine. °· Possibility of pregnancy, if this applies. °· History of blood clots (thrombophlebitis). °· History of bleeding or blood problems. °· Previous surgery. °· Other health problems. °RISKS AND COMPLICATIONS °· Injury to an abdominal organ, such as the bowel (large intestine), liver, spleen, or bladder. °· Possible infection. °· Bleeding. °· Low blood pressure (hypotension). °BEFORE THE PROCEDURE °This is a procedure that can be done as an outpatient. Confirm the time that you need to arrive for your procedure. A blood sample may be done to determine your blood clotting time. The presence of a severe bleeding disorder (coagulopathy) which cannot be promptly corrected may make this procedure inadvisable. You may be asked to urinate. °PROCEDURE °The procedure will take about 30 minutes. This time will vary depending on the amount of fluid that is removed. You may be asked to lie on your back with your head elevated. An area on your abdomen will be cleansed. A numbing medicine may then be injected (local anesthesia) into the skin and  tissue. A needle is inserted through your abdominal skin and tissues until it is positioned in your abdomen. You may feel pressure or slight pain as the needle is positioned into the abdomen. Fluid is removed from the abdomen through the needle. Tell your caregiver if you feel dizzy or lightheaded. The needle is withdrawn once the desired amount of fluid has been removed. A sample of the fluid may be sent for examination.  °AFTER THE PROCEDURE °Your recovery will be assessed and monitored. If there are no problems, as an outpatient, you should be able to go home shortly after the procedure. There may be a very limited amount of clear fluid draining from the needle insertion site over the next 2 days. Confirm with your caregiver as to the expected amount of drainage. °Obtaining the Test Results °It is your responsibility to obtain your test results. Do not assume everything is normal if you have not heard from your caregiver or the medical facility. It is important for you to follow up on all of your test results. °HOME CARE INSTRUCTIONS  °· You may resume normal diet and activities as directed or allowed. °· Only take over-the-counter or prescription medicines for pain, discomfort, or fever as directed by your caregiver. °SEEK IMMEDIATE MEDICAL CARE IF: °· You develop shortness of breath or chest pain. °· You develop increasing pain, discomfort, or swelling in your abdomen. °· You develop new drainage or pus coming from site where fluid was removed. °· You develop swelling or increased redness from site where fluid was removed. °· You develop an unexplained temperature of 102° F (38.9° C) or above. °Document Released: 11/12/2004 Document Revised: 07/22/2011 Document   Reviewed: 12/19/2008 °[employer]® Patient Information ©2015 [employer]. This information is not intended to replace advice given to you by your health care provider. Make sure you discuss any questions you have with your health care provider. ° °

## 2014-11-30 NOTE — Procedures (Signed)
Informed consent obtained. Under US guidance, paracentesis was performed. No immediate complication.

## 2014-12-06 ENCOUNTER — Encounter: Payer: Self-pay | Admitting: Emergency Medicine

## 2014-12-06 ENCOUNTER — Other Ambulatory Visit: Payer: Self-pay | Admitting: Radiology

## 2014-12-06 ENCOUNTER — Other Ambulatory Visit: Payer: Self-pay

## 2014-12-06 ENCOUNTER — Emergency Department: Payer: Medicare Other

## 2014-12-06 ENCOUNTER — Emergency Department
Admission: EM | Admit: 2014-12-06 | Discharge: 2014-12-06 | Disposition: A | Discharge status: Home / Self Care | Payer: Medicare Other | Attending: Emergency Medicine | Admitting: Emergency Medicine

## 2014-12-06 DIAGNOSIS — R0600 Dyspnea, unspecified: Secondary | ICD-10-CM

## 2014-12-06 DIAGNOSIS — R0602 Shortness of breath: Secondary | ICD-10-CM | POA: Diagnosis present

## 2014-12-06 DIAGNOSIS — J159 Unspecified bacterial pneumonia: Secondary | ICD-10-CM | POA: Diagnosis not present

## 2014-12-06 DIAGNOSIS — Z79899 Other long term (current) drug therapy: Secondary | ICD-10-CM | POA: Diagnosis not present

## 2014-12-06 DIAGNOSIS — Z7952 Long term (current) use of systemic steroids: Secondary | ICD-10-CM | POA: Diagnosis not present

## 2014-12-06 DIAGNOSIS — Z87891 Personal history of nicotine dependence: Secondary | ICD-10-CM | POA: Diagnosis not present

## 2014-12-06 DIAGNOSIS — J441 Chronic obstructive pulmonary disease with (acute) exacerbation: Secondary | ICD-10-CM | POA: Insufficient documentation

## 2014-12-06 LAB — COMPREHENSIVE METABOLIC PANEL
ALK PHOS: 67 U/L (ref 38–126)
ALT: 16 U/L (ref 14–54)
AST: 29 U/L (ref 15–41)
Albumin: 3.8 g/dL (ref 3.5–5.0)
Anion gap: 9 (ref 5–15)
BUN: 38 mg/dL — ABNORMAL HIGH (ref 6–20)
CHLORIDE: 93 mmol/L — AB (ref 101–111)
CO2: 25 mmol/L (ref 22–32)
Calcium: 8.8 mg/dL — ABNORMAL LOW (ref 8.9–10.3)
Creatinine, Ser: 1.6 mg/dL — ABNORMAL HIGH (ref 0.44–1.00)
GFR calc non Af Amer: 29 mL/min — ABNORMAL LOW (ref >60)
GFR, EST AFRICAN AMERICAN: 34 mL/min — AB (ref >60)
Glucose, Bld: 119 mg/dL — ABNORMAL HIGH (ref 65–99)
Potassium: 4.7 mmol/L (ref 3.5–5.1)
Sodium: 127 mmol/L — ABNORMAL LOW (ref 135–145)
TOTAL PROTEIN: 5.5 g/dL — AB (ref 6.5–8.1)
Total Bilirubin: 1.4 mg/dL — ABNORMAL HIGH (ref 0.3–1.2)

## 2014-12-06 LAB — CBC WITH DIFFERENTIAL/PLATELET
Basophils Absolute: 0 10*3/uL (ref 0–0.1)
Basophils Relative: 1 %
EOS ABS: 0.1 10*3/uL (ref 0–0.7)
Eosinophils Relative: 3 %
HEMATOCRIT: 34 % — AB (ref 35.0–47.0)
Hemoglobin: 11.5 g/dL — ABNORMAL LOW (ref 12.0–16.0)
LYMPHS ABS: 0.8 10*3/uL — AB (ref 1.0–3.6)
MCH: 33.3 pg (ref 26.0–34.0)
MCHC: 33.9 g/dL (ref 32.0–36.0)
MCV: 98.2 fL (ref 80.0–100.0)
MONO ABS: 0.7 10*3/uL (ref 0.2–0.9)
Monocytes Relative: 17 %
Neutro Abs: 2.4 10*3/uL (ref 1.4–6.5)
Platelets: 51 10*3/uL — ABNORMAL LOW (ref 150–440)
RBC: 3.46 MIL/uL — ABNORMAL LOW (ref 3.80–5.20)
RDW: 14.7 % — AB (ref 11.5–14.5)
WBC: 4 10*3/uL (ref 3.6–11.0)

## 2014-12-06 LAB — LIPASE, BLOOD: Lipase: 51 U/L (ref 22–51)

## 2014-12-06 LAB — AMMONIA

## 2014-12-06 MED ORDER — PREDNISONE 20 MG PO TABS: 20.0000 mg | ORAL_TABLET | Freq: Every day | ORAL | Status: DC

## 2014-12-06 MED ORDER — AZITHROMYCIN 250 MG PO TABS: ORAL_TABLET | ORAL | Status: DC

## 2014-12-06 NOTE — Discharge Instructions (Signed)

## 2014-12-06 NOTE — ED Notes (Signed)
Patient to ED with report of increasing shortness of breath over the last couple of weeks. Patient has history of having fluid drawn off of abdomen once a week for 2 years due to Cirrhosis. Patient reports that over the last several weeks it does not seem to be helping anymore.

## 2014-12-06 NOTE — ED Provider Notes (Signed)
[facility] Department Provider Note  ____________________________________________  Time seen: 5:20 PM  I have reviewed the triage vital signs and the nursing notes.   HISTORY  Chief Complaint Shortness of Breath    HPI Susan May is a 81 y.o. female with a history of cirrhosis who gets paracentesis in GI clinic by Dr. Candace Cruise every Wednesday. Her last paracentesis was 6 days ago on Wednesday. They normally removed 4-6 L. She complains of shortness of breath for the last 2 weeks. Nothing makes it better or worse. She does not have any worsened shortness of breath when lying flat. She thinks that she has excess fluid in her abdomen. No fever chills chest pain dizziness abdominal pain nausea vomiting or diarrhea. She is compliant with all her medicines including lactulose and rifampin.She is tolerating oral intake as usual.     Past Medical History  Diagnosis Date  . COPD (chronic obstructive pulmonary disease)   . Shortness of breath dyspnea   . Cancer     Hx: skin cancer x 2:  one melanoma     cirrhosis  Patient Active Problem List   Diagnosis Date Noted  . Cirrhosis   . NASH (nonalcoholic steatohepatitis)     Past Surgical History  Procedure Laterality Date  . Appendectomy    . Vaginal hysterectomy    . Back surgery      Current Outpatient Rx  Name  Route  Sig  Dispense  Refill  . azithromycin (ZITHROMAX Z-PAK) 250 MG tablet      Take 2 tablets (500 mg) on  Day 1,  followed by 1 tablet (250 mg) once daily on Days 2 through 5.   6 each   0   . escitalopram (LEXAPRO) 10 MG tablet   Oral   Take 10 mg by mouth daily.         Marland Kitchen esomeprazole (NEXIUM) 20 MG capsule   Oral   Take 20 mg by mouth daily at 12 noon.         . FentaNYL (DURAGESIC-12 TD)   Transdermal   Place onto the skin every 3 (three) days.         . furosemide (LASIX) 40 MG tablet   Oral   Take 40 mg by mouth 2 (two) times daily.         Marland Kitchen lactulose  (CHRONULAC) 10 GM/15ML solution   Oral   Take by mouth daily.         . metoCLOPramide (REGLAN) 5 MG tablet   Oral   Take 5 mg by mouth 4 (four) times daily.         . nadolol (CORGARD) 20 MG tablet   Oral   Take 20 mg by mouth daily.         . ondansetron (ZOFRAN-ODT) 4 MG disintegrating tablet   Oral   Take 4 mg by mouth every 8 (eight) hours as needed for nausea or vomiting.         . predniSONE (DELTASONE) 20 MG tablet   Oral   Take 1 tablet (20 mg total) by mouth daily.   8 tablet   0   . rifaximin (XIFAXAN) 550 MG TABS tablet   Oral   Take 550 mg by mouth 2 (two) times daily.         Marland Kitchen spironolactone (ALDACTONE) 25 MG tablet   Oral   Take 25 mg by mouth 2 (two) times daily.  Allergies Review of patient's allergies indicates no known allergies.  History reviewed. No pertinent family history.  Social History History  Substance Use Topics  . Smoking status: Former Smoker -- 0.50 packs/day    Types: Cigarettes    Start date: 09/12/1968    Quit date: 09/12/1989  . Smokeless tobacco: Never Used  . Alcohol Use: No    Review of Systems  Constitutional: No fever or chills. No weight changes Eyes:No blurry vision or double vision.  ENT: No sore throat. Cardiovascular: No chest pain. Respiratory: Positive dyspnea. Gastrointestinal: Negative for abdominal pain, vomiting and diarrhea.  No BRBPR or melena. Genitourinary: Negative for dysuria, urinary retention, bloody urine, or difficulty urinating. Musculoskeletal: Negative for back pain. No joint swelling or pain. Skin: Negative for rash. Neurological: Negative for headaches, focal weakness or numbness. Psychiatric:No anxiety or depression.   Endocrine:No hot/cold intolerance, changes in energy, or sleep difficulty.  10-point ROS otherwise negative.  ____________________________________________   PHYSICAL EXAM:  VITAL SIGNS: ED Triage Vitals  Enc Vitals Group     BP 12/06/14  1650 121/46 mmHg     Pulse Rate 12/06/14 1650 56     Resp 12/06/14 1650 22     Temp 12/06/14 1650 98.2 F (36.8 C)     Temp Source 12/06/14 1650 Oral     SpO2 12/06/14 1650 100 %     Weight 12/06/14 1650 148 lb (67.132 kg)     Height 12/06/14 1650 5\' 2"  (1.575 m)     Head Cir --      Peak Flow --      Pain Score --      Pain Loc --      Pain Edu? --      Excl. in [place]? --      Constitutional: Alert and oriented. Well appearing and in no distress. Eyes: No scleral icterus. No conjunctival pallor. PERRL. EOMI ENT   Head: Normocephalic and atraumatic.   Nose: No congestion/rhinnorhea. No septal hematoma   Mouth/Throat: MMM, no pharyngeal erythema. No peritonsillar mass. No uvula shift.   Neck: No stridor. No SubQ emphysema. No meningismus. Hematological/Lymphatic/Immunilogical: No cervical lymphadenopathy. Cardiovascular: RRR. Normal and symmetric distal pulses are present in all extremities. No murmurs, rubs, or gallops. Respiratory: Right basilar crackles. Normal respiratory effort, no tachypnea, no increased work of breathing. No wheezes.. Gastrointestinal: Soft and nontender. No distention. There is no CVA tenderness.  No rebound, rigidity, or guarding. Mild ascites without distention or mass effect or evidence of increased abdominal pressure Genitourinary: deferred Musculoskeletal: Nontender with normal range of motion in all extremities. No joint effusions.  No lower extremity tenderness.  No edema. Neurologic:   Normal speech and language.  CN 2-10 normal. Motor grossly intact. No pronator drift.  Normal gait. No gross focal neurologic deficits are appreciated.  Skin:  Skin is warm, dry and intact. No rash noted.  No petechiae, purpura, or bullae. Psychiatric: Mood and affect are normal. Speech and behavior are normal. Patient exhibits appropriate insight and judgment.  ____________________________________________    LABS (pertinent positives/negatives) (all  labs ordered are listed, but only abnormal results are displayed) Labs Reviewed  COMPREHENSIVE METABOLIC PANEL - Abnormal; Notable for the following:    Sodium 127 (*)    Chloride 93 (*)    Glucose, Bld 119 (*)    BUN 38 (*)    Creatinine, Ser 1.60 (*)    Calcium 8.8 (*)    Total Protein 5.5 (*)    Total Bilirubin 1.4 (*)  GFR calc non Af Amer 29 (*)    GFR calc Af Amer 34 (*)    All other components within normal limits  CBC WITH DIFFERENTIAL/PLATELET - Abnormal; Notable for the following:    RBC 3.46 (*)    Hemoglobin 11.5 (*)    HCT 34.0 (*)    RDW 14.7 (*)    Platelets 51 (*)    Lymphs Abs 0.8 (*)    All other components within normal limits  AMMONIA - Abnormal; Notable for the following:    Ammonia <9 (*)    All other components within normal limits  LIPASE, BLOOD   ____________________________________________   EKG  Interpreted by me Sinus bradycardia rate of 54, normal axis intervals QRS and ST segments and T waves.  ____________________________________________    RADIOLOGY  Chest x-ray unremarkable  ____________________________________________   PROCEDURES  ____________________________________________   INITIAL IMPRESSION / ASSESSMENT AND PLAN / ED COURSE  Pertinent labs & imaging results that were available during my care of the patient were reviewed by me and considered in my medical decision making (see chart for details).  Low suspicion for significant ascites that is causing symptoms. The dyspnea may be related to a clinic where pneumonia due to the lung findings. Abdomen is completely benign and low suspicion for SBP or compartment syndrome ----------------------------------------- 7:29 PM on 12/06/2014 -----------------------------------------  Workup unremarkable except for mild hyponatremia 127. This does not appear to be a new issue for her looking at history: Results. The patient complains of shortness of breath, but is vigorous and  talking in  6 and in no distress. She denies any chest pain at all. Low suspicion for ACS PE TAD pneumothorax carditis mediastinitis. Clinically I do suspect that she may have a mild right basilar pneumonia. No evidence of acidosis. I will start her on azithromycin for this. We'll also do a short course trial of low-dose steroids to see if this helps with her symptoms. I encouraged her to continue following up with GI for her routine paracentesis including her appointment tomorrow and to follow up with primary care this week. ____________________________________________   FINAL CLINICAL IMPRESSION(S) / ED DIAGNOSES  Final diagnoses:  Dyspnea   right basilar atypical community-acquired pneumonia    Carrie Mew, MD 12/06/14 1930

## 2014-12-06 NOTE — ED Notes (Signed)
Patient transported to X-ray 

## 2014-12-07 ENCOUNTER — Ambulatory Visit
Admission: RE | Admit: 2014-12-07 | Discharge: 2014-12-07 | Disposition: A | Discharge status: Home / Self Care | Payer: Medicare Other | Source: Ambulatory Visit | Attending: Gastroenterology | Admitting: Gastroenterology

## 2014-12-07 DIAGNOSIS — R188 Other ascites: Secondary | ICD-10-CM | POA: Insufficient documentation

## 2014-12-07 MED ORDER — ALBUMIN HUMAN 25 % IV SOLN
25.0000 g | Freq: Once | INTRAVENOUS | Status: AC
  Administered 2014-12-07: 25 g via INTRAVENOUS
  Filled 2014-12-07: qty 100

## 2014-12-13 ENCOUNTER — Other Ambulatory Visit: Payer: Self-pay | Admitting: Radiology

## 2014-12-14 ENCOUNTER — Ambulatory Visit
Admission: RE | Admit: 2014-12-14 | Discharge: 2014-12-14 | Disposition: A | Discharge status: Home / Self Care | Payer: Medicare Other | Source: Ambulatory Visit | Attending: Gastroenterology | Admitting: Gastroenterology

## 2014-12-14 ENCOUNTER — Other Ambulatory Visit: Payer: Self-pay | Admitting: Physician Assistant

## 2014-12-14 DIAGNOSIS — C4490 Unspecified malignant neoplasm of skin, unspecified: Secondary | ICD-10-CM | POA: Insufficient documentation

## 2014-12-14 DIAGNOSIS — K7460 Unspecified cirrhosis of liver: Secondary | ICD-10-CM | POA: Insufficient documentation

## 2014-12-14 DIAGNOSIS — J449 Chronic obstructive pulmonary disease, unspecified: Secondary | ICD-10-CM | POA: Diagnosis not present

## 2014-12-14 DIAGNOSIS — R188 Other ascites: Secondary | ICD-10-CM | POA: Diagnosis present

## 2014-12-14 DIAGNOSIS — K7581 Nonalcoholic steatohepatitis (NASH): Secondary | ICD-10-CM | POA: Insufficient documentation

## 2014-12-14 MED ORDER — ALBUMIN HUMAN 25 % IV SOLN
25.0000 g | Freq: Once | INTRAVENOUS | Status: AC
  Administered 2014-12-14: 25 g via INTRAVENOUS
  Filled 2014-12-14: qty 100

## 2014-12-14 MED ORDER — ALBUMIN HUMAN 25 % IV SOLN
12.5000 g | Freq: Once | INTRAVENOUS | Status: AC
  Administered 2014-12-14: 12.5 g via INTRAVENOUS
  Filled 2014-12-14 (×2): qty 50

## 2014-12-20 ENCOUNTER — Other Ambulatory Visit: Payer: Self-pay | Admitting: Radiology

## 2014-12-21 ENCOUNTER — Ambulatory Visit: Admission: RE | Admit: 2014-12-21 | Payer: Medicare Other | Source: Ambulatory Visit

## 2014-12-27 ENCOUNTER — Other Ambulatory Visit: Payer: Self-pay | Admitting: Radiology

## 2014-12-27 ENCOUNTER — Other Ambulatory Visit: Payer: Self-pay | Admitting: Physician Assistant

## 2014-12-28 ENCOUNTER — Ambulatory Visit
Admission: RE | Admit: 2014-12-28 | Discharge: 2014-12-28 | Disposition: A | Discharge status: Home / Self Care | Payer: Medicare Other | Source: Ambulatory Visit | Attending: Gastroenterology | Admitting: Gastroenterology

## 2014-12-28 DIAGNOSIS — K7460 Unspecified cirrhosis of liver: Secondary | ICD-10-CM | POA: Diagnosis present

## 2014-12-28 DIAGNOSIS — R188 Other ascites: Secondary | ICD-10-CM | POA: Diagnosis present

## 2014-12-28 MED ORDER — ALBUMIN HUMAN 25 % IV SOLN
25.0000 g | Freq: Once | INTRAVENOUS | Status: AC
Start: 2014-12-28 — End: 2014-12-28
  Administered 2014-12-28: 25 g via INTRAVENOUS
  Filled 2014-12-28: qty 100

## 2014-12-28 NOTE — Procedures (Signed)
Interventional Radiology Procedure Note  Procedure:  Ultrasound guided paracentesis  Complications:  None  Estimated Blood Loss: None  3.8 liters of ascites removed.  Venetia Night. Kathlene Cote, M.D Pager:  [phone]

## 2015-01-03 ENCOUNTER — Other Ambulatory Visit: Payer: Self-pay | Admitting: Radiology

## 2015-01-04 ENCOUNTER — Ambulatory Visit
Admission: RE | Admit: 2015-01-04 | Discharge: 2015-01-04 | Disposition: A | Discharge status: Home / Self Care | Payer: Medicare Other | Source: Ambulatory Visit | Attending: Gastroenterology | Admitting: Gastroenterology

## 2015-01-04 DIAGNOSIS — R188 Other ascites: Secondary | ICD-10-CM

## 2015-01-04 MED ORDER — ALBUMIN HUMAN 25 % IV SOLN
25.0000 g | Freq: Once | INTRAVENOUS | Status: AC
  Administered 2015-01-04: 25 g via INTRAVENOUS
  Filled 2015-01-04: qty 100

## 2015-01-04 NOTE — Procedures (Signed)
Paracentesis  Complications:  None  Blood Loss: none  See dictation in canopy pacs

## 2015-01-10 ENCOUNTER — Other Ambulatory Visit: Payer: Self-pay | Admitting: Radiology

## 2015-01-11 ENCOUNTER — Ambulatory Visit
Admission: RE | Admit: 2015-01-11 | Discharge: 2015-01-11 | Disposition: A | Discharge status: Home / Self Care | Payer: Medicare Other | Source: Ambulatory Visit | Attending: Gastroenterology | Admitting: Gastroenterology

## 2015-01-11 DIAGNOSIS — K7460 Unspecified cirrhosis of liver: Secondary | ICD-10-CM | POA: Insufficient documentation

## 2015-01-11 DIAGNOSIS — R188 Other ascites: Secondary | ICD-10-CM | POA: Insufficient documentation

## 2015-01-11 MED ORDER — ALBUMIN HUMAN 25 % IV SOLN
12.5000 g | Freq: Once | INTRAVENOUS | Status: AC
  Administered 2015-01-11: 12.5 g via INTRAVENOUS
  Filled 2015-01-11: qty 50

## 2015-01-11 MED ORDER — ALBUMIN HUMAN 25 % IV SOLN
25.0000 g | Freq: Once | INTRAVENOUS | Status: AC
  Administered 2015-01-11: 25 g via INTRAVENOUS
  Filled 2015-01-11: qty 100

## 2015-01-11 NOTE — Discharge Instructions (Signed)
Paracentesis °Paracentesis is a procedure used to remove excess fluid from the belly (abdomen). Excess fluid in the belly is called ascites. Excess fluid can be the result of certain conditions, such as infection, inflammation, abdominal injury, heart failure, chronic scarring of the liver (cirrhosis), or cancer. The excess fluid is removed using a needle inserted through the skin and tissue into the abdomen.  °A paracentesis may be done to: °· Determine the cause of the excess fluid through examination of the fluid. °· Relieve symptoms of shortness of breath or pain caused by the excess fluid. °· Determine presence of bleeding after an abdominal injury. °LET YOUR CAREGIVERS KNOW ABOUT: °· Allergies. °· Medications taken including herbs, eye drops, over-the-counter medications, and creams. °· Use of steroids (by mouth or creams). °· Previous problems with anesthetics or numbing medicine. °· Possibility of pregnancy, if this applies. °· History of blood clots (thrombophlebitis). °· History of bleeding or blood problems. °· Previous surgery. °· Other health problems. °RISKS AND COMPLICATIONS °· Injury to an abdominal organ, such as the bowel (large intestine), liver, spleen, or bladder. °· Possible infection. °· Bleeding. °· Low blood pressure (hypotension). °BEFORE THE PROCEDURE °This is a procedure that can be done as an outpatient. Confirm the time that you need to arrive for your procedure. A blood sample may be done to determine your blood clotting time. The presence of a severe bleeding disorder (coagulopathy) which cannot be promptly corrected may make this procedure inadvisable. You may be asked to urinate. °PROCEDURE °The procedure will take about 30 minutes. This time will vary depending on the amount of fluid that is removed. You may be asked to lie on your back with your head elevated. An area on your abdomen will be cleansed. A numbing medicine may then be injected (local anesthesia) into the skin and  tissue. A needle is inserted through your abdominal skin and tissues until it is positioned in your abdomen. You may feel pressure or slight pain as the needle is positioned into the abdomen. Fluid is removed from the abdomen through the needle. Tell your caregiver if you feel dizzy or lightheaded. The needle is withdrawn once the desired amount of fluid has been removed. A sample of the fluid may be sent for examination.  °AFTER THE PROCEDURE °Your recovery will be assessed and monitored. If there are no problems, as an outpatient, you should be able to go home shortly after the procedure. There may be a very limited amount of clear fluid draining from the needle insertion site over the next 2 days. Confirm with your caregiver as to the expected amount of drainage. °Obtaining the Test Results °It is your responsibility to obtain your test results. Do not assume everything is normal if you have not heard from your caregiver or the medical facility. It is important for you to follow up on all of your test results. °HOME CARE INSTRUCTIONS  °· You may resume normal diet and activities as directed or allowed. °· Only take over-the-counter or prescription medicines for pain, discomfort, or fever as directed by your caregiver. °SEEK IMMEDIATE MEDICAL CARE IF: °· You develop shortness of breath or chest pain. °· You develop increasing pain, discomfort, or swelling in your abdomen. °· You develop new drainage or pus coming from site where fluid was removed. °· You develop swelling or increased redness from site where fluid was removed. °· You develop an unexplained temperature of 102° F (38.9° C) or above. °Document Released: 11/12/2004 Document Revised: 07/22/2011 Document   Reviewed: 12/19/2008 °[employer]® Patient Information ©2015 [employer]. This information is not intended to replace advice given to you by your health care provider. Make sure you discuss any questions you have with your health care provider. ° °

## 2015-01-18 ENCOUNTER — Ambulatory Visit
Admission: RE | Admit: 2015-01-18 | Discharge: 2015-01-18 | Disposition: A | Discharge status: Home / Self Care | Payer: Medicare Other | Source: Ambulatory Visit | Attending: Gastroenterology | Admitting: Gastroenterology

## 2015-01-18 DIAGNOSIS — R188 Other ascites: Secondary | ICD-10-CM | POA: Diagnosis present

## 2015-01-18 MED ORDER — ALBUMIN HUMAN 25 % IV SOLN
25.0000 g | Freq: Once | INTRAVENOUS | Status: AC
  Administered 2015-01-18: 25 g via INTRAVENOUS
  Filled 2015-01-18: qty 100

## 2015-01-24 ENCOUNTER — Other Ambulatory Visit: Payer: Self-pay | Admitting: Gastroenterology

## 2015-01-24 DIAGNOSIS — R188 Other ascites: Secondary | ICD-10-CM

## 2015-01-25 ENCOUNTER — Ambulatory Visit
Admission: RE | Admit: 2015-01-25 | Discharge: 2015-01-25 | Disposition: A | Discharge status: Home / Self Care | Payer: Medicare Other | Source: Ambulatory Visit | Attending: Gastroenterology | Admitting: Gastroenterology

## 2015-01-25 DIAGNOSIS — R188 Other ascites: Secondary | ICD-10-CM | POA: Diagnosis present

## 2015-01-25 MED ORDER — ALBUMIN HUMAN 25 % IV SOLN
12.5000 g | Freq: Once | INTRAVENOUS | Status: DC
  Filled 2015-01-25: qty 50

## 2015-01-25 MED ORDER — ALBUMIN HUMAN 25 % IV SOLN
25.0000 g | Freq: Once | INTRAVENOUS | Status: AC
  Administered 2015-01-25: 25 g via INTRAVENOUS
  Filled 2015-01-25: qty 100

## 2015-01-25 MED ORDER — ALBUMIN HUMAN 25 % IV SOLN
25.0000 g | Freq: Once | INTRAVENOUS | Status: DC
  Filled 2015-01-25: qty 100

## 2015-01-30 NOTE — OR Nursing (Signed)
TC pt family called, she has had pain in side where paracentesis was done last week. Dr Nyoka Cowden notified. Pt encouraged to take regular pain medication. Pt inquired if she could skip Wed. Paracentesis. She was encouraged to come as scheduled.

## 2015-01-31 ENCOUNTER — Other Ambulatory Visit: Payer: Self-pay | Admitting: Radiology

## 2015-02-01 ENCOUNTER — Ambulatory Visit
Admission: RE | Admit: 2015-02-01 | Discharge: 2015-02-01 | Disposition: A | Discharge status: Home / Self Care | Payer: Medicare Other | Source: Ambulatory Visit | Attending: Gastroenterology | Admitting: Gastroenterology

## 2015-02-01 DIAGNOSIS — R188 Other ascites: Secondary | ICD-10-CM | POA: Diagnosis present

## 2015-02-01 MED ORDER — ALBUMIN HUMAN 25 % IV SOLN
12.5000 g | Freq: Once | INTRAVENOUS | Status: DC
  Filled 2015-02-01: qty 50

## 2015-02-01 MED ORDER — ALBUMIN HUMAN 25 % IV SOLN
25.0000 g | Freq: Once | INTRAVENOUS | Status: AC
  Administered 2015-02-01: 25 g via INTRAVENOUS
  Filled 2015-02-01: qty 100

## 2015-02-08 ENCOUNTER — Ambulatory Visit
Admission: RE | Admit: 2015-02-08 | Discharge: 2015-02-08 | Disposition: A | Discharge status: Home / Self Care | Payer: Medicare Other | Source: Ambulatory Visit | Attending: Gastroenterology | Admitting: Gastroenterology

## 2015-02-08 DIAGNOSIS — R188 Other ascites: Secondary | ICD-10-CM | POA: Insufficient documentation

## 2015-02-08 MED ORDER — ALBUMIN HUMAN 25 % IV SOLN
25.0000 g | Freq: Once | INTRAVENOUS | Status: AC
  Administered 2015-02-08: 25 g via INTRAVENOUS
  Filled 2015-02-08: qty 100

## 2015-02-08 NOTE — Procedures (Signed)
Under US guidance, paracentesis was performed. No immediate complication. 

## 2015-02-14 ENCOUNTER — Other Ambulatory Visit: Payer: Self-pay | Admitting: Radiology

## 2015-02-15 ENCOUNTER — Ambulatory Visit: Admission: RE | Admit: 2015-02-15 | Payer: Medicare Other | Source: Ambulatory Visit

## 2015-02-16 ENCOUNTER — Emergency Department
Admission: EM | Admit: 2015-02-16 | Discharge: 2015-02-17 | Disposition: A | Discharge status: Home / Self Care | Payer: Medicare Other | Attending: Emergency Medicine | Admitting: Emergency Medicine

## 2015-02-16 ENCOUNTER — Encounter: Payer: Self-pay | Admitting: Emergency Medicine

## 2015-02-16 DIAGNOSIS — Z792 Long term (current) use of antibiotics: Secondary | ICD-10-CM | POA: Diagnosis not present

## 2015-02-16 DIAGNOSIS — Z79899 Other long term (current) drug therapy: Secondary | ICD-10-CM | POA: Insufficient documentation

## 2015-02-16 DIAGNOSIS — E860 Dehydration: Secondary | ICD-10-CM | POA: Diagnosis present

## 2015-02-16 DIAGNOSIS — K5900 Constipation, unspecified: Secondary | ICD-10-CM | POA: Diagnosis not present

## 2015-02-16 DIAGNOSIS — Z87891 Personal history of nicotine dependence: Secondary | ICD-10-CM | POA: Diagnosis not present

## 2015-02-16 DIAGNOSIS — Z79891 Long term (current) use of opiate analgesic: Secondary | ICD-10-CM | POA: Diagnosis not present

## 2015-02-16 LAB — COMPREHENSIVE METABOLIC PANEL
ALT: 17 U/L (ref 14–54)
AST: 30 U/L (ref 15–41)
Albumin: 3.5 g/dL (ref 3.5–5.0)
Alkaline Phosphatase: 88 U/L (ref 38–126)
Anion gap: 6 (ref 5–15)
BUN: 45 mg/dL — ABNORMAL HIGH (ref 6–20)
CALCIUM: 8.8 mg/dL — AB (ref 8.9–10.3)
CHLORIDE: 103 mmol/L (ref 101–111)
CO2: 30 mmol/L (ref 22–32)
CREATININE: 1.32 mg/dL — AB (ref 0.44–1.00)
GFR, EST AFRICAN AMERICAN: 43 mL/min — AB (ref >60)
GFR, EST NON AFRICAN AMERICAN: 37 mL/min — AB (ref >60)
Glucose, Bld: 106 mg/dL — ABNORMAL HIGH (ref 65–99)
Potassium: 4.9 mmol/L (ref 3.5–5.1)
Sodium: 139 mmol/L (ref 135–145)
TOTAL PROTEIN: 5.5 g/dL — AB (ref 6.5–8.1)
Total Bilirubin: 2.4 mg/dL — ABNORMAL HIGH (ref 0.3–1.2)

## 2015-02-16 LAB — URINALYSIS COMPLETE WITH MICROSCOPIC (ARMC ONLY)
BILIRUBIN URINE: NEGATIVE
Bacteria, UA: NONE SEEN
GLUCOSE, UA: NEGATIVE mg/dL
HGB URINE DIPSTICK: NEGATIVE
KETONES UR: NEGATIVE mg/dL
LEUKOCYTES UA: NEGATIVE
NITRITE: NEGATIVE
PH: 6 (ref 5.0–8.0)
Protein, ur: NEGATIVE mg/dL
RBC / HPF: NONE SEEN RBC/hpf (ref 0–5)
Specific Gravity, Urine: 1.017 (ref 1.005–1.030)

## 2015-02-16 LAB — CBC WITH DIFFERENTIAL/PLATELET
BASOS ABS: 0.1 10*3/uL (ref 0–0.1)
EOS ABS: 0.2 10*3/uL (ref 0–0.7)
HCT: 38.4 % (ref 35.0–47.0)
Hemoglobin: 13.1 g/dL (ref 12.0–16.0)
Lymphs Abs: 1.2 10*3/uL (ref 1.0–3.6)
MCH: 34 pg (ref 26.0–34.0)
MCHC: 34.2 g/dL (ref 32.0–36.0)
MCV: 99.6 fL (ref 80.0–100.0)
Monocytes Absolute: 0.8 10*3/uL (ref 0.2–0.9)
Monocytes Relative: 17 %
Neutro Abs: 2.4 10*3/uL (ref 1.4–6.5)
Neutrophils Relative %: 52 %
PLATELETS: 49 10*3/uL — AB (ref 150–440)
RBC: 3.86 MIL/uL (ref 3.80–5.20)
RDW: 14.3 % (ref 11.5–14.5)
WBC: 4.7 10*3/uL (ref 3.6–11.0)

## 2015-02-16 LAB — LIPASE, BLOOD: LIPASE: 70 U/L — AB (ref 22–51)

## 2015-02-16 NOTE — ED Provider Notes (Signed)
[facility] Emergency Department Provider Note  Time seen: 10:38 PM  I have reviewed the triage vital signs and the nursing notes.   HISTORY  Chief Complaint Dehydration    HPI Susan May is a 81 y.o. female with a past medical history of COPD, cirrhosis,presents to the emergency department with mild abdominal discomfort/pain, 5 days of constipation, and generalized weakness. According to the patient and her daughter for the past 5 days the patient has not had a bowel movement. The patient takes daily lactulose and typically has 4-5 bowel movements per day. She is also noted some mild abdominal discomfort, which she states is somewhat typical for her. Denies any dysuria, fever, nausea, vomiting. She has noted increased weakness over the past 2 days. Daughter was concerned that they have talked to her primary care physician who is prescribed magnesium citrate, MiraLAX, without relief of her constipation so daughter called EMS and brought her to the emergency department for evaluation. Patient denies any abdominal pain at this time.     Past Medical History  Diagnosis Date  . COPD (chronic obstructive pulmonary disease) ([place])   . Shortness of breath dyspnea   . Cancer ([place])     Hx: skin cancer x 2:  one melanoma      Patient Active Problem List   Diagnosis Date Noted  . Cirrhosis ([place])   . NASH (nonalcoholic steatohepatitis)     Past Surgical History  Procedure Laterality Date  . Appendectomy    . Vaginal hysterectomy    . Back surgery      Current Outpatient Rx  Name  Route  Sig  Dispense  Refill  . azithromycin (ZITHROMAX Z-PAK) 250 MG tablet      Take 2 tablets (500 mg) on  Day 1,  followed by 1 tablet (250 mg) once daily on Days 2 through 5.   6 each   0   . benzonatate (TESSALON) 100 MG capsule   Oral   Take 100 mg by mouth 3 (three) times daily as needed for cough.         . cefUROXime (CEFTIN) 500 MG tablet   Oral   Take 500 mg  by mouth 2 (two) times daily with a meal.         . escitalopram (LEXAPRO) 10 MG tablet   Oral   Take 10 mg by mouth daily.         Marland Kitchen esomeprazole (NEXIUM) 20 MG capsule   Oral   Take 20 mg by mouth daily at 12 noon.         . FentaNYL (DURAGESIC-12 TD)   Transdermal   Place onto the skin every 3 (three) days.         . fluticasone (FLONASE) 50 MCG/ACT nasal spray   Each Nare   Place into both nostrils daily.         . furosemide (LASIX) 40 MG tablet   Oral   Take 80 mg by mouth 2 (two) times daily.          Marland Kitchen lactulose (CHRONULAC) 10 GM/15ML solution   Oral   Take by mouth daily.         Marland Kitchen levofloxacin (LEVAQUIN) 250 MG tablet   Oral   Take 250 mg by mouth daily.         Marland Kitchen loratadine (CLARITIN) 10 MG tablet   Oral   Take 10 mg by mouth daily.         Marland Kitchen  metoCLOPramide (REGLAN) 5 MG tablet   Oral   Take 5 mg by mouth 4 (four) times daily.         . nadolol (CORGARD) 20 MG tablet   Oral   Take 20 mg by mouth daily.         . ondansetron (ZOFRAN-ODT) 4 MG disintegrating tablet   Oral   Take 4 mg by mouth every 8 (eight) hours as needed for nausea or vomiting.         . predniSONE (DELTASONE) 20 MG tablet   Oral   Take 1 tablet (20 mg total) by mouth daily.   8 tablet   0   . rifaximin (XIFAXAN) 550 MG TABS tablet   Oral   Take 550 mg by mouth 2 (two) times daily.         Marland Kitchen spironolactone (ALDACTONE) 25 MG tablet   Oral   Take 25 mg by mouth 2 (two) times daily.           Allergies Review of patient's allergies indicates no known allergies.  No family history on file.  Social History Social History  Substance Use Topics  . Smoking status: Former Smoker -- 0.50 packs/day    Types: Cigarettes    Start date: 09/12/1968    Quit date: 09/12/1989  . Smokeless tobacco: Never Used  . Alcohol Use: No    Review of Systems Constitutional: Negative for fever Cardiovascular: Negative for chest pain. Respiratory: Negative for  shortness of breath. Gastrointestinal: Mild abdominal discomfort at times. Negative for nausea or vomiting. Positive for constipation 5 days. Genitourinary: Negative for dysuria Neurological: Negative for headache 10-point ROS otherwise negative.  ____________________________________________   PHYSICAL EXAM:  VITAL SIGNS: ED Triage Vitals  Enc Vitals Group     BP 02/16/15 2149 119/45 mmHg     Pulse Rate 02/16/15 2149 62     Resp 02/16/15 2149 18     Temp 02/16/15 2149 97.9 F (36.6 C)     Temp Source 02/16/15 2149 Oral     SpO2 02/16/15 2149 99 %     Weight 02/16/15 2149 134 lb (60.782 kg)     Height 02/16/15 2149 5\' 1"  (1.549 m)     Head Cir --      Peak Flow --      Pain Score 02/16/15 2152 0     Pain Loc --      Pain Edu? --      Excl. in [place]? --     Constitutional: Alert and oriented. Well appearing and in no distress. Eyes: Normal exam ENT   Head: Normocephalic and atraumatic   Mouth/Throat: Mucous membranes are moist. Cardiovascular: Normal rate, regular rhythm. No murmur Respiratory: Normal respiratory effort without tachypnea nor retractions. Breath sounds are clear Gastrointestinal: Soft and nontender. No distention.   Musculoskeletal: Nontender with normal range of motion in all extremities. Neurologic:  Normal speech and language. No gross focal neurologic deficits Psychiatric: Mood and affect are normal. Speech and behavior are normal. ____________________________________________    INITIAL IMPRESSION / ASSESSMENT AND PLAN / ED COURSE  Pertinent labs & imaging results that were available during my care of the patient were reviewed by me and considered in my medical decision making (see chart for details).  Patient with 5 days of constipation, 2 days of weakness, with intermittent mild abdominal discomfort. Patient has a history of cirrhosis, is on lactulose daily and typically has multiple bowel movements per day. The last 4-5 days she  has not had  any bowel movements even with the use of MiraLAX and magnesium citrate. Patient has mild abdominal discomfort at times which she states is normal for her, she has a paracentesis every Wednesday to remove ascites, and does not feel like she has to much fluid currently. Currently the patient appears well, no acute distress, no tenderness with abdominal palpation. We will check labs, rectal exam to rule out impaction.    Rectal exam shows light brown stool, soft stool in the rectum, no fecal impaction. Given no abdominal tenderness currently. No complaints of abdominal pain currently, no fecal impaction, we'll attempt an enema. Patient care signed out to Dr. Archie Balboa. I discussed this plan of care with the patient and her daughter and they are agreeable. If labs are at baseline and she has relief with enema believe the patient would be safe for discharge home.  ____________________________________________   FINAL CLINICAL IMPRESSION(S) / ED DIAGNOSES   Constipation   Harvest Dark, MD 02/16/15 2255

## 2015-02-16 NOTE — ED Notes (Signed)
MD Paduchowski at bedside  

## 2015-02-16 NOTE — ED Notes (Signed)
MD at bedside for eval.

## 2015-02-16 NOTE — ED Notes (Signed)
Pt arrives via EMS from home. Per EMS, pt's daughter stated pt has not eat or drank much the past few days and last BM was 4 days ago. Pt denies fever or urinary symptoms. Pt denies pain. Pt c/o weakness. Pt AAOx4. NAD noted. RR Even and nonlabored.

## 2015-02-17 ENCOUNTER — Emergency Department: Payer: Medicare Other

## 2015-02-17 DIAGNOSIS — K5900 Constipation, unspecified: Secondary | ICD-10-CM | POA: Diagnosis not present

## 2015-02-17 MED ORDER — ONDANSETRON 4 MG PO TBDP
ORAL_TABLET | ORAL | Status: AC
  Filled 2015-02-17: qty 1

## 2015-02-17 MED ORDER — ONDANSETRON 4 MG PO TBDP
4.0000 mg | ORAL_TABLET | Freq: Once | ORAL | Status: AC
  Administered 2015-02-17: 4 mg via ORAL

## 2015-02-17 NOTE — ED Provider Notes (Signed)
-----------------------------------------   1:52 AM on 02/17/2015 -----------------------------------------  On my exam at this time patient states that she has had a bowel movement and she feels a little bit better. Her abdomen is soft and nontender. I discussed with the patient that she could use a fleets enema later today to help continue her bowel movements. I did discuss return precautions with the patient.  Nance Pear, MD 02/17/15 0200

## 2015-02-17 NOTE — ED Notes (Addendum)
Pt was able to have moderate size BM after the soap suds enema, she reports that she feels some relief after the enema.   She denies pain.

## 2015-02-17 NOTE — Discharge Instructions (Signed)
Please seek medical attention for any high fevers, chest pain, shortness of breath, change in behavior, persistent vomiting, bloody stool or any other new or concerning symptoms.   Constipation, Adult Constipation is when a person has fewer than three bowel movements a week, has difficulty having a bowel movement, or has stools that are dry, hard, or larger than normal. As people grow older, constipation is more common. A low-fiber diet, not taking in enough fluids, and taking certain medicines may make constipation worse.  CAUSES   Certain medicines, such as antidepressants, pain medicine, iron supplements, antacids, and water pills.   Certain diseases, such as diabetes, irritable bowel syndrome (IBS), thyroid disease, or depression.   Not drinking enough water.   Not eating enough fiber-rich foods.   Stress or travel.   Lack of physical activity or exercise.   Ignoring the urge to have a bowel movement.   Using laxatives too much.  SIGNS AND SYMPTOMS   Having fewer than three bowel movements a week.   Straining to have a bowel movement.   Having stools that are hard, dry, or larger than normal.   Feeling full or bloated.   Pain in the lower abdomen.   Not feeling relief after having a bowel movement.  DIAGNOSIS  Your health care provider will take a medical history and perform a physical exam. Further testing may be done for severe constipation. Some tests may include:  A barium enema X-ray to examine your rectum, colon, and, sometimes, your small intestine.   A sigmoidoscopy to examine your lower colon.   A colonoscopy to examine your entire colon. TREATMENT  Treatment will depend on the severity of your constipation and what is causing it. Some dietary treatments include drinking more fluids and eating more fiber-rich foods. Lifestyle treatments may include regular exercise. If these diet and lifestyle recommendations do not help, your health care  provider may recommend taking over-the-counter laxative medicines to help you have bowel movements. Prescription medicines may be prescribed if over-the-counter medicines do not work.  HOME CARE INSTRUCTIONS   Eat foods that have a lot of fiber, such as fruits, vegetables, whole grains, and beans.  Limit foods high in fat and processed sugars, such as french fries, hamburgers, cookies, candies, and soda.   A fiber supplement may be added to your diet if you cannot get enough fiber from foods.   Drink enough fluids to keep your urine clear or pale yellow.   Exercise regularly or as directed by your health care provider.   Go to the restroom when you have the urge to go. Do not hold it.   Only take over-the-counter or prescription medicines as directed by your health care provider. Do not take other medicines for constipation without talking to your health care provider first.  [place] IF:   You have bright red blood in your stool.   Your constipation lasts for more than 4 days or gets worse.   You have abdominal or rectal pain.   You have thin, pencil-like stools.   You have unexplained weight loss. MAKE SURE YOU:   Understand these instructions.  Will watch your condition.  Will get help right away if you are not doing well or get worse.   This information is not intended to replace advice given to you by your health care provider. Make sure you discuss any questions you have with your health care provider.   Document Released: 01/26/2004 Document Revised: 05/20/2014 Document Reviewed:  02/08/2013 Elsevier Interactive Patient Education [employer].

## 2015-02-21 ENCOUNTER — Other Ambulatory Visit: Payer: Self-pay | Admitting: Radiology

## 2015-02-22 ENCOUNTER — Ambulatory Visit
Admission: RE | Admit: 2015-02-22 | Discharge: 2015-02-22 | Disposition: A | Discharge status: Home / Self Care | Payer: Medicare Other | Source: Ambulatory Visit | Attending: Gastroenterology | Admitting: Gastroenterology

## 2015-02-22 DIAGNOSIS — R188 Other ascites: Secondary | ICD-10-CM | POA: Diagnosis present

## 2015-02-22 MED ORDER — ALBUMIN HUMAN 25 % IV SOLN
25.0000 g | Freq: Once | INTRAVENOUS | Status: AC
  Administered 2015-02-22: 25 g via INTRAVENOUS
  Filled 2015-02-22: qty 100

## 2015-03-01 ENCOUNTER — Ambulatory Visit: Admission: RE | Admit: 2015-03-01 | Payer: Medicare Other | Source: Ambulatory Visit

## 2015-03-07 ENCOUNTER — Other Ambulatory Visit: Payer: Self-pay | Admitting: Physician Assistant

## 2015-03-08 ENCOUNTER — Ambulatory Visit
Admission: RE | Admit: 2015-03-08 | Discharge: 2015-03-08 | Disposition: A | Discharge status: Home / Self Care | Payer: Medicare Other | Source: Ambulatory Visit | Attending: Gastroenterology | Admitting: Gastroenterology

## 2015-03-08 DIAGNOSIS — R188 Other ascites: Secondary | ICD-10-CM | POA: Diagnosis not present

## 2015-03-08 MED ORDER — ALBUMIN HUMAN 25 % IV SOLN
25.0000 g | Freq: Once | INTRAVENOUS | Status: AC
  Administered 2015-03-08: 25 g via INTRAVENOUS
  Filled 2015-03-08: qty 100

## 2015-03-08 NOTE — Progress Notes (Signed)
Unable to obtain IV access.  Dr. Earleen Newport was not able to start IV with ultrasound.  Procedure done without Albumin.

## 2015-03-10 ENCOUNTER — Other Ambulatory Visit: Payer: Self-pay | Admitting: Gastroenterology

## 2015-03-10 DIAGNOSIS — R188 Other ascites: Secondary | ICD-10-CM

## 2015-03-14 ENCOUNTER — Other Ambulatory Visit: Payer: Self-pay | Admitting: Radiology

## 2015-03-15 ENCOUNTER — Ambulatory Visit
Admission: RE | Admit: 2015-03-15 | Discharge: 2015-03-15 | Disposition: A | Discharge status: Home / Self Care | Payer: Medicare Other | Source: Ambulatory Visit | Attending: Gastroenterology | Admitting: Gastroenterology

## 2015-03-15 ENCOUNTER — Other Ambulatory Visit: Payer: Self-pay | Admitting: Gastroenterology

## 2015-03-15 DIAGNOSIS — R1905 Periumbilic swelling, mass or lump: Secondary | ICD-10-CM | POA: Diagnosis present

## 2015-03-15 DIAGNOSIS — R1909 Other intra-abdominal and pelvic swelling, mass and lump: Secondary | ICD-10-CM

## 2015-03-15 DIAGNOSIS — R188 Other ascites: Secondary | ICD-10-CM | POA: Insufficient documentation

## 2015-03-15 DIAGNOSIS — K439 Ventral hernia without obstruction or gangrene: Secondary | ICD-10-CM | POA: Diagnosis not present

## 2015-03-15 MED ORDER — ALBUMIN HUMAN 25 % IV SOLN
25.0000 g | Freq: Once | INTRAVENOUS | Status: AC
  Administered 2015-03-15: 25 g via INTRAVENOUS
  Filled 2015-03-15: qty 100

## 2015-03-17 ENCOUNTER — Encounter: Payer: Self-pay | Admitting: Emergency Medicine

## 2015-03-17 ENCOUNTER — Observation Stay
Admission: EM | Admit: 2015-03-17 | Discharge: 2015-03-19 | Disposition: A | Discharge status: Home / Self Care | Payer: Medicare Other | Attending: Internal Medicine | Admitting: Internal Medicine

## 2015-03-17 DIAGNOSIS — K7290 Hepatic failure, unspecified without coma: Secondary | ICD-10-CM | POA: Diagnosis not present

## 2015-03-17 DIAGNOSIS — E860 Dehydration: Secondary | ICD-10-CM | POA: Diagnosis not present

## 2015-03-17 DIAGNOSIS — Z79891 Long term (current) use of opiate analgesic: Secondary | ICD-10-CM | POA: Insufficient documentation

## 2015-03-17 DIAGNOSIS — N179 Acute kidney failure, unspecified: Secondary | ICD-10-CM | POA: Diagnosis not present

## 2015-03-17 DIAGNOSIS — Z87891 Personal history of nicotine dependence: Secondary | ICD-10-CM | POA: Insufficient documentation

## 2015-03-17 DIAGNOSIS — N183 Chronic kidney disease, stage 3 (moderate): Secondary | ICD-10-CM | POA: Insufficient documentation

## 2015-03-17 DIAGNOSIS — Z87440 Personal history of urinary (tract) infections: Secondary | ICD-10-CM | POA: Diagnosis not present

## 2015-03-17 DIAGNOSIS — I129 Hypertensive chronic kidney disease with stage 1 through stage 4 chronic kidney disease, or unspecified chronic kidney disease: Secondary | ICD-10-CM | POA: Diagnosis not present

## 2015-03-17 DIAGNOSIS — R140 Abdominal distension (gaseous): Secondary | ICD-10-CM | POA: Insufficient documentation

## 2015-03-17 DIAGNOSIS — Z85820 Personal history of malignant melanoma of skin: Secondary | ICD-10-CM | POA: Insufficient documentation

## 2015-03-17 DIAGNOSIS — Z9049 Acquired absence of other specified parts of digestive tract: Secondary | ICD-10-CM | POA: Diagnosis not present

## 2015-03-17 DIAGNOSIS — Z66 Do not resuscitate: Secondary | ICD-10-CM | POA: Diagnosis not present

## 2015-03-17 DIAGNOSIS — N189 Chronic kidney disease, unspecified: Secondary | ICD-10-CM

## 2015-03-17 DIAGNOSIS — J449 Chronic obstructive pulmonary disease, unspecified: Secondary | ICD-10-CM | POA: Diagnosis not present

## 2015-03-17 DIAGNOSIS — K210 Gastro-esophageal reflux disease with esophagitis: Secondary | ICD-10-CM | POA: Diagnosis not present

## 2015-03-17 DIAGNOSIS — E869 Volume depletion, unspecified: Secondary | ICD-10-CM | POA: Insufficient documentation

## 2015-03-17 DIAGNOSIS — R918 Other nonspecific abnormal finding of lung field: Secondary | ICD-10-CM | POA: Diagnosis not present

## 2015-03-17 DIAGNOSIS — R110 Nausea: Secondary | ICD-10-CM | POA: Diagnosis present

## 2015-03-17 DIAGNOSIS — K7030 Alcoholic cirrhosis of liver without ascites: Secondary | ICD-10-CM | POA: Diagnosis not present

## 2015-03-17 DIAGNOSIS — Z79899 Other long term (current) drug therapy: Secondary | ICD-10-CM | POA: Diagnosis not present

## 2015-03-17 DIAGNOSIS — D696 Thrombocytopenia, unspecified: Secondary | ICD-10-CM | POA: Insufficient documentation

## 2015-03-17 DIAGNOSIS — R0600 Dyspnea, unspecified: Secondary | ICD-10-CM

## 2015-03-17 DIAGNOSIS — Z7951 Long term (current) use of inhaled steroids: Secondary | ICD-10-CM | POA: Diagnosis not present

## 2015-03-17 DIAGNOSIS — I959 Hypotension, unspecified: Secondary | ICD-10-CM | POA: Insufficient documentation

## 2015-03-17 LAB — URINALYSIS COMPLETE WITH MICROSCOPIC (ARMC ONLY)
Bilirubin Urine: NEGATIVE
Glucose, UA: NEGATIVE mg/dL
HGB URINE DIPSTICK: NEGATIVE
KETONES UR: NEGATIVE mg/dL
LEUKOCYTES UA: NEGATIVE
NITRITE: NEGATIVE
PH: 5 (ref 5.0–8.0)
PROTEIN: NEGATIVE mg/dL
SPECIFIC GRAVITY, URINE: 1.009 (ref 1.005–1.030)

## 2015-03-17 LAB — CBC
HEMATOCRIT: 35.5 % (ref 35.0–47.0)
HEMOGLOBIN: 12.1 g/dL (ref 12.0–16.0)
MCH: 34.1 pg — ABNORMAL HIGH (ref 26.0–34.0)
MCHC: 34.1 g/dL (ref 32.0–36.0)
MCV: 100.1 fL — AB (ref 80.0–100.0)
Platelets: 54 10*3/uL — ABNORMAL LOW (ref 150–440)
RBC: 3.54 MIL/uL — ABNORMAL LOW (ref 3.80–5.20)
RDW: 14.5 % (ref 11.5–14.5)
WBC: 4.5 10*3/uL (ref 3.6–11.0)

## 2015-03-17 LAB — COMPREHENSIVE METABOLIC PANEL
ALBUMIN: 3.7 g/dL (ref 3.5–5.0)
ALT: 15 U/L (ref 14–54)
ANION GAP: 5 (ref 5–15)
AST: 26 U/L (ref 15–41)
Alkaline Phosphatase: 73 U/L (ref 38–126)
BUN: 40 mg/dL — AB (ref 6–20)
CHLORIDE: 104 mmol/L (ref 101–111)
CO2: 27 mmol/L (ref 22–32)
Calcium: 8.9 mg/dL (ref 8.9–10.3)
Creatinine, Ser: 2.19 mg/dL — ABNORMAL HIGH (ref 0.44–1.00)
GFR calc Af Amer: 23 mL/min — ABNORMAL LOW (ref >60)
GFR, EST NON AFRICAN AMERICAN: 20 mL/min — AB (ref >60)
GLUCOSE: 110 mg/dL — AB (ref 65–99)
POTASSIUM: 4.4 mmol/L (ref 3.5–5.1)
Sodium: 136 mmol/L (ref 135–145)
Total Bilirubin: 1.7 mg/dL — ABNORMAL HIGH (ref 0.3–1.2)
Total Protein: 5.3 g/dL — ABNORMAL LOW (ref 6.5–8.1)

## 2015-03-17 LAB — LIPASE, BLOOD: LIPASE: 66 U/L — AB (ref 11–51)

## 2015-03-17 LAB — LACTIC ACID, PLASMA
LACTIC ACID, VENOUS: 1.6 mmol/L (ref 0.5–2.0)
Lactic Acid, Venous: 1.6 mmol/L (ref 0.5–2.0)

## 2015-03-17 MED ORDER — SODIUM CHLORIDE 0.9 % IV BOLUS (SEPSIS)
500.0000 mL | Freq: Once | INTRAVENOUS | Status: AC
  Administered 2015-03-17: 500 mL via INTRAVENOUS

## 2015-03-17 MED ORDER — ONDANSETRON 4 MG PO TBDP
4.0000 mg | ORAL_TABLET | Freq: Three times a day (TID) | ORAL | Status: DC
  Administered 2015-03-18 – 2015-03-19 (×4): 4 mg via ORAL
  Filled 2015-03-17 (×7): qty 1

## 2015-03-17 MED ORDER — ACETAMINOPHEN 650 MG RE SUPP: 650.0000 mg | Freq: Four times a day (QID) | RECTAL | Status: DC | PRN

## 2015-03-17 MED ORDER — SODIUM CHLORIDE 0.9 % IJ SOLN: 3.0000 mL | Freq: Two times a day (BID) | INTRAMUSCULAR | Status: DC

## 2015-03-17 MED ORDER — FLUTICASONE PROPIONATE 50 MCG/ACT NA SUSP
1.0000 | Freq: Every day | NASAL | Status: DC
  Filled 2015-03-17: qty 16

## 2015-03-17 MED ORDER — PROCHLORPERAZINE EDISYLATE 5 MG/ML IJ SOLN
10.0000 mg | Freq: Four times a day (QID) | INTRAMUSCULAR | Status: DC | PRN
Start: 2015-03-17 — End: 2015-03-19
  Administered 2015-03-17: 10 mg via INTRAVENOUS
  Filled 2015-03-17 (×2): qty 2

## 2015-03-17 MED ORDER — RIFAXIMIN 550 MG PO TABS
550.0000 mg | ORAL_TABLET | Freq: Two times a day (BID) | ORAL | Status: DC
  Administered 2015-03-17 – 2015-03-19 (×3): 550 mg via ORAL
  Filled 2015-03-17 (×4): qty 1

## 2015-03-17 MED ORDER — METOCLOPRAMIDE HCL 5 MG PO TABS
5.0000 mg | ORAL_TABLET | Freq: Four times a day (QID) | ORAL | Status: DC
  Administered 2015-03-17 – 2015-03-19 (×6): 5 mg via ORAL
  Filled 2015-03-17 (×6): qty 1

## 2015-03-17 MED ORDER — BENZONATATE 100 MG PO CAPS
100.0000 mg | ORAL_CAPSULE | Freq: Three times a day (TID) | ORAL | Status: DC | PRN
Start: 2015-03-17 — End: 2015-03-19

## 2015-03-17 MED ORDER — PANTOPRAZOLE SODIUM 40 MG PO TBEC
40.0000 mg | DELAYED_RELEASE_TABLET | Freq: Every day | ORAL | Status: DC
  Administered 2015-03-17 – 2015-03-19 (×3): 40 mg via ORAL
  Filled 2015-03-17 (×3): qty 1

## 2015-03-17 MED ORDER — SODIUM CHLORIDE 0.9 % IV SOLN
INTRAVENOUS | Status: DC
  Administered 2015-03-17 – 2015-03-18 (×3): via INTRAVENOUS

## 2015-03-17 MED ORDER — ACETAMINOPHEN 325 MG PO TABS: 650.0000 mg | ORAL_TABLET | Freq: Four times a day (QID) | ORAL | Status: DC | PRN

## 2015-03-17 MED ORDER — PANTOPRAZOLE SODIUM 40 MG PO TBEC: 40.0000 mg | DELAYED_RELEASE_TABLET | Freq: Every day | ORAL | Status: DC

## 2015-03-17 MED ORDER — LORATADINE 10 MG PO TABS
10.0000 mg | ORAL_TABLET | Freq: Every day | ORAL | Status: DC
  Administered 2015-03-17 – 2015-03-19 (×3): 10 mg via ORAL
  Filled 2015-03-17 (×3): qty 1

## 2015-03-17 NOTE — ED Notes (Signed)
Pt arrived via EMS from home with reports of feeling sick.  H/o cirrhosis of the liver and currently goes weekly for paracentesis. Was given 4mg  Zofran IM with EMS. Lives at home with caregiver. No vomiting.

## 2015-03-17 NOTE — ED Notes (Signed)
Patient reports feeling a little better, but is still nauseated.  IV fluids completed at this time.

## 2015-03-17 NOTE — H&P (Signed)
[place] [facility] [place] NAME: Susan May    MR#:  [id number]  DATE OF BIRTH:  1931-12-08  DATE OF ADMISSION:  03/17/2015  PRIMARY CARE PHYSICIAN: Dion Body, MD   REQUESTING/REFERRING PHYSICIAN:   CHIEF COMPLAINT:   Chief Complaint  Patient presents with  . Nausea    HISTORY OF PRESENT ILLNESS: Susan May  is a 81 y.o. female with a known history of alcoholic liver cirrhosis, nausea,  ascites , gastroesophageal reflux disease,  esophagitis presents to the hospital with complaints of worsening nausea and inability to eat. According to the patient , She has been having problems with nausea for a while, today it but worse, she is not able to eat or drink well and she presents dehydrated and hypotensive. When the patient's family however patient has not been eating well for the past one week. Patient's labs in the emergency room revealed acute on chronic renal failure and hospitalist services were contacted for admission after patient's blood pressure failed to improve with 1-1/2 L of IV fluid administration.   PAST MEDICAL HISTORY:   Past Medical History  Diagnosis Date  . COPD (chronic obstructive pulmonary disease) ([place])   . Shortness of breath dyspnea   . Cancer ([place])     Hx: skin cancer x 2:  one melanoma      PAST SURGICAL HISTORY:  Past Surgical History  Procedure Laterality Date  . Appendectomy    . Vaginal hysterectomy    . Back surgery      SOCIAL HISTORY:  Social History  Substance Use Topics  . Smoking status: Former Smoker -- 0.50 packs/day    Types: Cigarettes    Start date: 09/12/1968    Quit date: 09/12/1989  . Smokeless tobacco: Never Used  . Alcohol Use: No    FAMILY HISTORY: History reviewed. No pertinent family history.  DRUG ALLERGIES: No Known Allergies  Review of Systems  Constitutional: Negative for fever, chills, weight loss and malaise/fatigue.  HENT: Negative for congestion.    Eyes: Negative for blurred vision and double vision.  Respiratory: Positive for shortness of breath. Negative for cough, sputum production and wheezing.   Cardiovascular: Positive for orthopnea and leg swelling. Negative for chest pain, palpitations and PND.  Gastrointestinal: Positive for nausea. Negative for vomiting, abdominal pain, diarrhea, constipation, blood in stool and melena.  Genitourinary: Negative for dysuria, urgency, frequency and hematuria.  Musculoskeletal: Negative for falls.  Skin: Negative for rash.  Neurological: Negative for dizziness and weakness.  Psychiatric/Behavioral: Negative for depression and memory loss. The patient is not nervous/anxious.     MEDICATIONS AT HOME:  Prior to Admission medications   Medication Sig Start Date End Date Taking? Authorizing Provider  azithromycin (ZITHROMAX Z-PAK) 250 MG tablet Take 2 tablets (500 mg) on  Day 1,  followed by 1 tablet (250 mg) once daily on Days 2 through 5. 12/06/14   Carrie Mew, MD  benzonatate (TESSALON) 100 MG capsule Take 100 mg by mouth 3 (three) times daily as needed for cough.    Historical Provider, MD  cefUROXime (CEFTIN) 500 MG tablet Take 500 mg by mouth 2 (two) times daily with a meal.    Historical Provider, MD  escitalopram (LEXAPRO) 10 MG tablet Take 10 mg by mouth daily.    Historical Provider, MD  esomeprazole (NEXIUM) 20 MG capsule Take 20 mg by mouth daily at 12 noon.    Historical Provider, MD  FentaNYL (DURAGESIC-12 TD) Place onto  the skin every 3 (three) days.    Historical Provider, MD  fluticasone (FLONASE) 50 MCG/ACT nasal spray Place into both nostrils daily.    Historical Provider, MD  furosemide (LASIX) 40 MG tablet Take 80 mg by mouth 2 (two) times daily.     Historical Provider, MD  lactulose (CHRONULAC) 10 GM/15ML solution Take by mouth daily.    Historical Provider, MD  levofloxacin (LEVAQUIN) 250 MG tablet Take 250 mg by mouth daily.    Historical Provider, MD  loratadine  (CLARITIN) 10 MG tablet Take 10 mg by mouth daily.    Historical Provider, MD  metoCLOPramide (REGLAN) 5 MG tablet Take 5 mg by mouth 4 (four) times daily.    Historical Provider, MD  nadolol (CORGARD) 20 MG tablet Take 20 mg by mouth daily.    Historical Provider, MD  ondansetron (ZOFRAN-ODT) 4 MG disintegrating tablet Take 4 mg by mouth every 8 (eight) hours.     Historical Provider, MD  pantoprazole (PROTONIX) 40 MG tablet Take 40 mg by mouth daily.    Historical Provider, MD  predniSONE (DELTASONE) 20 MG tablet Take 1 tablet (20 mg total) by mouth daily. 12/06/14   Carrie Mew, MD  rifaximin (XIFAXAN) 550 MG TABS tablet Take 550 mg by mouth 2 (two) times daily.    Historical Provider, MD  spironolactone (ALDACTONE) 25 MG tablet Take 25 mg by mouth 2 (two) times daily.    Historical Provider, MD      PHYSICAL EXAMINATION:   VITAL SIGNS: Blood pressure 101/49, pulse 58, temperature 98.1 F (36.7 C), temperature source Oral, resp. rate 16, height 5\' 2"  (1.575 m), weight 60.782 kg (134 lb), SpO2 100 %.  GENERAL:  81 y.o.-year-old patient lying in the bed with no acute distress. Dry oral mucosa EYES: Pupils equal, round, reactive to light and accommodation. No scleral icterus. Extraocular muscles intact.  HEENT: Head atraumatic, normocephalic. Oropharynx and nasopharynx clear.  NECK:  Supple, no jugular venous distention. No thyroid enlargement, no tenderness.  LUNGS: Normal breath sounds bilaterally , no wheezing, but some basilar rales,rhonchi and crepitations noted in the back. No use of accessory muscles of respiration. Bilateral CVA percussion is negative for tenderness CARDIOVASCULAR: S1, S2 normal. Follow-up at the sixth systolic murmur noted an aortic auscultation side but also in precordium,  no rubs, or gallops.  ABDOMEN: Soft, nontender, nondistended. Bowel sounds present. No organomegaly or mass. Ascitic fluid that way was felt on percussion and palpation EXTREMITIES: Trace  pedal edema, no cyanosis, or clubbing.  NEUROLOGIC: Cranial nerves II through XII are intact. Muscle strength 5/5 in all extremities. Sensation intact. Gait not checked.  PSYCHIATRIC: The patient is alert and oriented x 3.  SKIN: No obvious rash, lesion, or ulcer.   LABORATORY PANEL:   CBC  Recent Labs Lab 03/17/15 1148  WBC 4.5  HGB 12.1  HCT 35.5  PLT 54*  MCV 100.1*  MCH 34.1*  MCHC 34.1  RDW 14.5   ------------------------------------------------------------------------------------------------------------------  Chemistries   Recent Labs Lab 03/17/15 1148  NA 136  K 4.4  CL 104  CO2 27  GLUCOSE 110*  BUN 40*  CREATININE 2.19*  CALCIUM 8.9  AST 26  ALT 15  ALKPHOS 73  BILITOT 1.7*   ------------------------------------------------------------------------------------------------------------------  Cardiac Enzymes No results for input(s): TROPONINI in the last 168 hours. ------------------------------------------------------------------------------------------------------------------  RADIOLOGY: No results found.  EKG: Orders placed or performed during the hospital encounter of 12/06/14  . ED EKG  . ED EKG  . EKG  IMPRESSION AND PLAN:  Principal Problem:   Hypotension Active Problems:   Acute on chronic renal failure (HCC)  1. Hypotension likely due to dehydration as well as use of blood pressure medications and diuretics, stop them continue patient on low-dose daily for the following blood pressure readings closely 2. Acute on chronic renal failure likely due to ATN,  Hypotension, continue patient on the low grade IV fluids, follow patient's kidney function , get urinalysis to rule out urinary  d tract infection,  3. Dyspnea,  could be related to the deconditioning, get  chest x-ray 4. Nausea continuation on PPIs and nausea medications and get gastroenterology evaluation   All the records are reviewed and case discussed with ED  provider. Management plans discussed with the patient, family and they are in agreement.  CODE STATUS:    TOTAL TIME TAKING CARE OF THIS PATIENT 50 minutes.    Theodoro Grist M.D on 03/17/2015 at 5:48 PM  Between 7am to 6pm - Pager - [phone] After 6pm go to [email or web address] - password [email or web address] [facility]  Office  [phone]  CC: Primary care physician; Dion Body, MD

## 2015-03-17 NOTE — ED Provider Notes (Signed)
[facility] Emergency Department Provider Note    ____________________________________________  Time seen: 1305  I have reviewed the triage vital signs and the nursing notes.   HISTORY  Chief Complaint Nausea   History limited by: Not Limited   HPI Susan May is a 81 y.o. female with history of cirrhosis who presents to the emergency department because she states she is feeling unwell. She states that she has been feeling nauseous. She states that this goes on all the time. It was worse this morning. She states she does have a history of a [employer] fundoplication so even though she was feeling nauseous she did not vomit. She denies any severe pain or pain in the abdomen. Denies any change in defecation recently. Denies any fevers.     Past Medical History  Diagnosis Date  . COPD (chronic obstructive pulmonary disease) ([place])   . Shortness of breath dyspnea   . Cancer ([place])     Hx: skin cancer x 2:  one melanoma      Patient Active Problem List   Diagnosis Date Noted  . Cirrhosis ([place])   . NASH (nonalcoholic steatohepatitis)     Past Surgical History  Procedure Laterality Date  . Appendectomy    . Vaginal hysterectomy    . Back surgery      Current Outpatient Rx  Name  Route  Sig  Dispense  Refill  . azithromycin (ZITHROMAX Z-PAK) 250 MG tablet      Take 2 tablets (500 mg) on  Day 1,  followed by 1 tablet (250 mg) once daily on Days 2 through 5.   6 each   0   . benzonatate (TESSALON) 100 MG capsule   Oral   Take 100 mg by mouth 3 (three) times daily as needed for cough.         . cefUROXime (CEFTIN) 500 MG tablet   Oral   Take 500 mg by mouth 2 (two) times daily with a meal.         . escitalopram (LEXAPRO) 10 MG tablet   Oral   Take 10 mg by mouth daily.         Marland Kitchen esomeprazole (NEXIUM) 20 MG capsule   Oral   Take 20 mg by mouth daily at 12 noon.         . FentaNYL (DURAGESIC-12 TD)   Transdermal   Place onto the  skin every 3 (three) days.         . fluticasone (FLONASE) 50 MCG/ACT nasal spray   Each Nare   Place into both nostrils daily.         . furosemide (LASIX) 40 MG tablet   Oral   Take 80 mg by mouth 2 (two) times daily.          Marland Kitchen lactulose (CHRONULAC) 10 GM/15ML solution   Oral   Take by mouth daily.         Marland Kitchen levofloxacin (LEVAQUIN) 250 MG tablet   Oral   Take 250 mg by mouth daily.         Marland Kitchen loratadine (CLARITIN) 10 MG tablet   Oral   Take 10 mg by mouth daily.         . metoCLOPramide (REGLAN) 5 MG tablet   Oral   Take 5 mg by mouth 4 (four) times daily.         . nadolol (CORGARD) 20 MG tablet   Oral   Take 20 mg  by mouth daily.         . ondansetron (ZOFRAN-ODT) 4 MG disintegrating tablet   Oral   Take 4 mg by mouth every 8 (eight) hours.          . pantoprazole (PROTONIX) 40 MG tablet   Oral   Take 40 mg by mouth daily.         . predniSONE (DELTASONE) 20 MG tablet   Oral   Take 1 tablet (20 mg total) by mouth daily.   8 tablet   0   . rifaximin (XIFAXAN) 550 MG TABS tablet   Oral   Take 550 mg by mouth 2 (two) times daily.         Marland Kitchen spironolactone (ALDACTONE) 25 MG tablet   Oral   Take 25 mg by mouth 2 (two) times daily.           Allergies Review of patient's allergies indicates no known allergies.  History reviewed. No pertinent family history.  Social History Social History  Substance Use Topics  . Smoking status: Former Smoker -- 0.50 packs/day    Types: Cigarettes    Start date: 09/12/1968    Quit date: 09/12/1989  . Smokeless tobacco: Never Used  . Alcohol Use: No    Review of Systems  Constitutional: Negative for fever. Cardiovascular: Negative for chest pain. Respiratory: Negative for shortness of breath. Gastrointestinal: Positive for nausea Genitourinary: Negative for dysuria. Musculoskeletal: Negative for back pain. Skin: Negative for rash. Neurological: Negative for headaches, focal weakness or  numbness.  10-point ROS otherwise negative.  ____________________________________________   PHYSICAL EXAM:  VITAL SIGNS: ED Triage Vitals  Enc Vitals Group     BP 03/17/15 1140 123/55 mmHg     Pulse Rate 03/17/15 1140 59     Resp 03/17/15 1140 18     Temp 03/17/15 1140 98.2 F (36.8 C)     Temp Source 03/17/15 1140 Oral     SpO2 03/17/15 1140 99 %     Weight 03/17/15 1140 134 lb (60.782 kg)     Height 03/17/15 1140 5\' 2"  (1.575 m)   Constitutional: Alert and oriented. Well appearing and in no distress. Eyes: Conjunctivae are normal. PERRL. Normal extraocular movements. ENT   Head: Normocephalic and atraumatic.   Nose: No congestion/rhinnorhea.   Mouth/Throat: Mucous membranes are moist.   Neck: No stridor. Hematological/Lymphatic/Immunilogical: No cervical lymphadenopathy. Cardiovascular: Normal rate, regular rhythm.  No murmurs, rubs, or gallops. Respiratory: Normal respiratory effort without tachypnea nor retractions. Breath sounds are clear and equal bilaterally. No wheezes/rales/rhonchi. Gastrointestinal: Soft and nontender. No distention.  Genitourinary: Deferred Musculoskeletal: Normal range of motion in all extremities. No joint effusions.  No lower extremity tenderness nor edema. Neurologic:  Normal speech and language. No gross focal neurologic deficits are appreciated.  Skin:  Skin is warm, dry and intact. No rash noted. Psychiatric: Mood and affect are normal. Speech and behavior are normal. Patient exhibits appropriate insight and judgment.  ____________________________________________    LABS (pertinent positives/negatives)  Labs Reviewed  LIPASE, BLOOD - Abnormal; Notable for the following:    Lipase 66 (*)    All other components within normal limits  COMPREHENSIVE METABOLIC PANEL - Abnormal; Notable for the following:    Glucose, Bld 110 (*)    BUN 40 (*)    Creatinine, Ser 2.19 (*)    Total Protein 5.3 (*)    Total Bilirubin 1.7 (*)     GFR calc non Af Amer 20 (*)  GFR calc Af Amer 23 (*)    All other components within normal limits  CBC - Abnormal; Notable for the following:    RBC 3.54 (*)    MCV 100.1 (*)    MCH 34.1 (*)    Platelets 54 (*)    All other components within normal limits  URINALYSIS COMPLETEWITH MICROSCOPIC ([facility] ONLY)     ____________________________________________   EKG  None  ____________________________________________    RADIOLOGY  None    ____________________________________________   PROCEDURES  Procedure(s) performed: None  Critical Care performed: No  ____________________________________________   INITIAL IMPRESSION / ASSESSMENT AND PLAN / ED COURSE  Pertinent labs & imaging results that were available during my care of the patient were reviewed by me and considered in my medical decision making (see chart for details).  Patient presented to the emergency department today because of feeling unwell. On exam patient appears well. No abdominal tenderness however possibly some smiled ascites. Blood work without any leukocytosis. Patient did feel better after the initial 500 mL bolus. However while here patient did have a couple episodes of low blood pressure. Family states she does normally run low. However given this I will add on a lactating give another fluid bolus. At this point no clear etiology or signs of infection.  ____________________________________________   FINAL CLINICAL IMPRESSION(S) / ED DIAGNOSES  Nausea  Nance Pear, MD 03/17/15 1614

## 2015-03-17 NOTE — Progress Notes (Signed)
Dr. Lavetta Nielsen notified pt BP trending down and remaining low. MD acknowledged, no new orders. Will continue to assess.

## 2015-03-17 NOTE — ED Provider Notes (Signed)
-----------------------------------------   5:17 PM on 03/17/2015 -----------------------------------------  I reassessed the patient. She continues to feel nauseated, is not tolerating by mouth intake very well. Her blood pressures continued to dip down into the 90s/40s despite additional normal saline boluses. She feels unable to go home and I doubt that she will be able to adequately orally hydrate if she is sent home at this point. Lactic acid is reassuring at 1.6. Case discussed with hospitalist for admission.  Joanne Gavel, MD 03/17/15 [id number]

## 2015-03-18 ENCOUNTER — Observation Stay: Payer: Medicare Other

## 2015-03-18 DIAGNOSIS — I129 Hypertensive chronic kidney disease with stage 1 through stage 4 chronic kidney disease, or unspecified chronic kidney disease: Secondary | ICD-10-CM | POA: Diagnosis not present

## 2015-03-18 DIAGNOSIS — R110 Nausea: Secondary | ICD-10-CM

## 2015-03-18 LAB — CBC
HEMATOCRIT: 27.7 % — AB (ref 35.0–47.0)
Hemoglobin: 9.3 g/dL — ABNORMAL LOW (ref 12.0–16.0)
MCH: 33.9 pg (ref 26.0–34.0)
MCHC: 33.7 g/dL (ref 32.0–36.0)
MCV: 100.6 fL — AB (ref 80.0–100.0)
Platelets: 35 10*3/uL — ABNORMAL LOW (ref 150–440)
RBC: 2.75 MIL/uL — ABNORMAL LOW (ref 3.80–5.20)
RDW: 14.5 % (ref 11.5–14.5)
WBC: 2.8 10*3/uL — ABNORMAL LOW (ref 3.6–11.0)

## 2015-03-18 LAB — BASIC METABOLIC PANEL
Anion gap: 6 (ref 5–15)
BUN: 43 mg/dL — AB (ref 6–20)
CHLORIDE: 109 mmol/L (ref 101–111)
CO2: 23 mmol/L (ref 22–32)
Calcium: 8.3 mg/dL — ABNORMAL LOW (ref 8.9–10.3)
Creatinine, Ser: 2.16 mg/dL — ABNORMAL HIGH (ref 0.44–1.00)
GFR calc Af Amer: 23 mL/min — ABNORMAL LOW (ref >60)
GFR, EST NON AFRICAN AMERICAN: 20 mL/min — AB (ref >60)
GLUCOSE: 80 mg/dL (ref 65–99)
POTASSIUM: 4 mmol/L (ref 3.5–5.1)
Sodium: 138 mmol/L (ref 135–145)

## 2015-03-18 MED ORDER — LACTULOSE 10 GM/15ML PO SOLN
30.0000 g | Freq: Every day | ORAL | Status: DC
  Administered 2015-03-18 – 2015-03-19 (×2): 30 g via ORAL
  Filled 2015-03-18 (×2): qty 60

## 2015-03-18 NOTE — Consult Note (Signed)
Date: 03/18/2015                  Patient Name:  Susan May  MRN: [id number]  DOB: 30-Nov-1931  Age / Sex: 81 y.o., female         PCP: Dion Body, MD                 Service Requesting Consult: Internal medicine, Dr Benjie Karvonen                 Reason for Consult:  acute renal failure             History of Present Illness: Patient is a 81 y.o. female with medical problems of nonalcoholic liver cirrhosis, ascites, GERD, esophagitis, COPD, who was admitted to [facility] on 03/17/2015 for evaluation of severe nausea since yesterday morning. Patient reports she had kids over who were sick at home. No other cause for nausea. No new medications. No fevers or chills. No dysuria. Patient regularly gets ultrasound-guided paracentesis as outpatient She is followed by Dr. Candace Cruise Baseline creatinine appears to be 1.32 but it has been variable Admission creatinine was 2.19 yesterday Today's creatinine is 2.16   Medications: Outpatient medications: Prescriptions prior to admission  Medication Sig Dispense Refill Last Dose  . albuterol (PROVENTIL HFA;VENTOLIN HFA) 108 (90 BASE) MCG/ACT inhaler Inhale 2 puffs into the lungs every 6 (six) hours as needed for wheezing or shortness of breath.   Past Month at Unknown time  . fentaNYL (DURAGESIC - DOSED MCG/HR) 25 MCG/HR patch Place 25 mcg onto the skin every 3 (three) days.   03/16/2015 at 1800  . fluticasone (FLONASE) 50 MCG/ACT nasal spray Place 2 sprays into both nostrils daily as needed for rhinitis.    Past Week at Unknown time  . furosemide (LASIX) 40 MG tablet Take 40 mg by mouth 2 (two) times daily.    03/16/2015 at Unknown time  . lactulose (CHRONULAC) 10 GM/15ML solution Take 30 g by mouth daily.    03/16/2015 at Unknown time  . loratadine (CLARITIN) 10 MG tablet Take 10 mg by mouth daily.   03/16/2015 at Unknown time  . metoCLOPramide (REGLAN) 5 MG tablet Take 5 mg by mouth 4 (four) times daily.   03/16/2015 at Unknown time  . nadolol (CORGARD) 20 MG  tablet Take 20 mg by mouth daily.   03/16/2015 at 0900  . ondansetron (ZOFRAN-ODT) 8 MG disintegrating tablet Take 8 mg by mouth every 8 (eight) hours as needed for nausea or vomiting.   03/17/2015 at Unknown time  . pantoprazole (PROTONIX) 40 MG tablet Take 40 mg by mouth daily.   03/16/2015 at Unknown time  . rifaximin (XIFAXAN) 550 MG TABS tablet Take 550 mg by mouth 2 (two) times daily.   03/16/2015 at Unknown time  . spironolactone (ALDACTONE) 25 MG tablet Take 25 mg by mouth 2 (two) times daily.   03/16/2015 at Unknown time    Current medications: Current Facility-Administered Medications  Medication Dose Route Frequency Provider Last Rate Last Dose  . 0.9 %  sodium chloride infusion   Intravenous Continuous Theodoro Grist, MD 50 mL/hr at 03/18/15 0546    . acetaminophen (TYLENOL) tablet 650 mg  650 mg Oral Q6H PRN Theodoro Grist, MD       Or  . acetaminophen (TYLENOL) suppository 650 mg  650 mg Rectal Q6H PRN Theodoro Grist, MD      . benzonatate (TESSALON) capsule 100 mg  100 mg Oral TID PRN Rima  Ether Griffins, MD      . fluticasone Asencion Islam) 50 MCG/ACT nasal spray 1 spray  1 spray Each Nare Daily Theodoro Grist, MD   1 spray at 03/17/15 2107  . loratadine (CLARITIN) tablet 10 mg  10 mg Oral Daily Theodoro Grist, MD   10 mg at 03/18/15 0847  . metoCLOPramide (REGLAN) tablet 5 mg  5 mg Oral QID Theodoro Grist, MD   5 mg at 03/18/15 0847  . ondansetron (ZOFRAN-ODT) disintegrating tablet 4 mg  4 mg Oral 3 times per day Theodoro Grist, MD   4 mg at 03/18/15 0546  . pantoprazole (PROTONIX) EC tablet 40 mg  40 mg Oral Daily Theodoro Grist, MD   40 mg at 03/18/15 0847  . prochlorperazine (COMPAZINE) injection 10 mg  10 mg Intravenous Q6H PRN Nance Pear, MD   10 mg at 03/17/15 1402  . rifaximin (XIFAXAN) tablet 550 mg  550 mg Oral BID Theodoro Grist, MD   550 mg at 03/17/15 2019  . sodium chloride 0.9 % injection 3 mL  3 mL Intravenous Q12H Theodoro Grist, MD   3 mL at 03/17/15 2158      Allergies: No  Known Allergies    Past Medical History: Past Medical History  Diagnosis Date  . COPD (chronic obstructive pulmonary disease) ([place])   . Shortness of breath dyspnea   . Cancer ([place])     Hx: skin cancer x 2:  one melanoma       Past Surgical History: Past Surgical History  Procedure Laterality Date  . Appendectomy    . Vaginal hysterectomy    . Back surgery       Family History: History reviewed. No pertinent family history.   Social History: Social History   Social History  . Marital Status: Widowed    Spouse Name: N/A  . Number of Children: N/A  . Years of Education: N/A   Occupational History  . Not on file.   Social History Main Topics  . Smoking status: Former Smoker -- 0.50 packs/day    Types: Cigarettes    Start date: 09/12/1968    Quit date: 09/12/1989  . Smokeless tobacco: Never Used  . Alcohol Use: No  . Drug Use: No  . Sexual Activity: Not on file   Other Topics Concern  . Not on file   Social History Narrative     Review of Systems: Gen:  no fevers or chills  HEENT:  decreased hearing  CV:  no cardiac issues  Resp:  no shortness of breath  GI: takes lactulose regularly for cirrhosis  GU :  no problems with voiding, no dysuria  MS:  normally able to walk around at baseline  Derm:   no complaints  Psych: no complaints  Heme:  no complaints  Neuro:  complaints  EndocrineNo complaints   Vital Signs: Blood pressure 107/34, pulse 66, temperature 98.5 F (36.9 C), temperature source Oral, resp. rate 19, height 5\' 2"  (1.575 m), weight 56.972 kg (125 lb 9.6 oz), SpO2 98 %.   Intake/Output Summary (Last 24 hours) at 03/18/15 1031 Last data filed at 03/17/15 2127  Gross per 24 hour  Intake   1500 ml  Output    500 ml  Net   1000 ml    Weight trends: Filed Weights   03/17/15 1140 03/17/15 2125  Weight: 60.782 kg (134 lb) 56.972 kg (125 lb 9.6 oz)    Physical Exam: General:  Thin, frail, elderly laying in the bed  HEENT  moist  oral mucous membranes, decreased hearing   Neck:  supple   Lungs: Normal respiratory effort, clear to auscultation bilaterally   Heart::  Regular rhythm, no rub or gallop   Abdomen: Soft, distended, nontender   Extremities:  No peripheral edema   Neurologic: Alert, oriented, able to answer questions   Skin: Warm, dry   Access:   Foley:        Lab results: Basic Metabolic Panel:  Recent Labs Lab 03/17/15 1148 03/18/15 0418  NA 136 138  K 4.4 4.0  CL 104 109  CO2 27 23  GLUCOSE 110* 80  BUN 40* 43*  CREATININE 2.19* 2.16*  CALCIUM 8.9 8.3*    Liver Function Tests:  Recent Labs Lab 03/17/15 1148  AST 26  ALT 15  ALKPHOS 73  BILITOT 1.7*  PROT 5.3*  ALBUMIN 3.7    Recent Labs Lab 03/17/15 1148  LIPASE 66*   No results for input(s): AMMONIA in the last 168 hours.  CBC:  Recent Labs Lab 03/17/15 1148 03/18/15 0418  WBC 4.5 2.8*  HGB 12.1 9.3*  HCT 35.5 27.7*  MCV 100.1* 100.6*  PLT 54* 35*    Cardiac Enzymes: No results for input(s): CKTOTAL, TROPONINI in the last 168 hours.  BNP: Invalid input(s): POCBNP  CBG: No results for input(s): GLUCAP in the last 168 hours.  Microbiology: No results found for this or any previous visit (from the past 720 hour(s)).   Coagulation Studies: No results for input(s): LABPROT, INR in the last 72 hours.  Urinalysis:  Recent Labs  03/17/15 1821  COLORURINE YELLOW*  LABSPEC 1.009  PHURINE 5.0  GLUCOSEU NEGATIVE  HGBUR NEGATIVE  BILIRUBINUR NEGATIVE  KETONESUR NEGATIVE  PROTEINUR NEGATIVE  NITRITE NEGATIVE  LEUKOCYTESUR NEGATIVE      Imaging: Dg Chest Port 1 View  03/18/2015  CLINICAL DATA:  81 year old female with history of dyspnea. EXAM: PORTABLE CHEST 1 VIEW COMPARISON:  Chest x-ray 12/06/2014. FINDINGS: Low lung volumes. Skin fold artifact overlying the lower left hemithorax. No acute consolidative airspace disease. No pleural effusions. No evidence of pulmonary edema. Heart size is mildly  enlarged. Upper mediastinal contours are grossly distorted by patient's rotation. Atherosclerosis in the thoracic aorta. IMPRESSION: 1. No radiographic evidence of acute cardiopulmonary disease. 2. Atherosclerosis. 3. Mild cardiomegaly. Electronically Signed   By: Vinnie Langton M.D.   On: 03/18/2015 09:16      Assessment & Plan: Pt is a 81 y.o. yo female with a PMHX of liver cirrhosis, ascites, COPD, esophagitis, chronic kidney disease, was admitted on 03/17/2015 with intractable nausea. Patient ate breakfast this morning. Feels better.  1. Acute renal failure Likely secondary to volume fluctuations Patient was on furosemide, lactulose, spironolactone at home It may have caused volume depletion since patient's by mouth intake was very poor Currently she is getting IV normal saline at 50 cc per hour Serum creatinine stabilizes as compared to yesterday We'll continue to follow 2. Chronic kidney disease stage III Baseline creatinine 1.32/GFR 3.  3.Liver cirrhosis Diuretics on hold at present  D/w Family and Dr Benjie Karvonen

## 2015-03-18 NOTE — Plan of Care (Signed)
Problem: Fluid Volume: Goal: Ability to maintain a balanced intake and output will improve Outcome: Progressing 1. Pt and family verbalize basis understanding. Pt verifies meds as she takes them with understanding of their purpose. 2. Reviewed current plan of care with pt and family agreeable. 3. Routine tests at this point with no needs. 4. Educated/reviewed meds each time they were administered. Restarted lactulose today; held  xifamin due to constipation x 3 days. 5. Pt compliant with safety measures; no attempts to get OOB unassisted. Rested quietly at long intervals with naps. 6. Denies co's pain thus far this shift; just "feel bad". 7. IVf's continued. Drinking and eating well; tolerating diet. Compliant with meds/care.

## 2015-03-18 NOTE — Progress Notes (Signed)
[place] [facility] [place] NAME: Susan May    MR#:  [id number]  DATE OF BIRTH:  07-11-1931  SUBJECTIVE:  Patient is doing well this point. Patient has some weakness but no other issues.  REVIEW OF SYSTEMS:    Review of Systems  Constitutional: Positive for malaise/fatigue. Negative for fever and chills.  HENT: Negative for sore throat.   Eyes: Negative for blurred vision.  Respiratory: Negative for cough, hemoptysis, shortness of breath and wheezing.   Cardiovascular: Negative for chest pain, palpitations and leg swelling.  Gastrointestinal: Negative for nausea, vomiting, abdominal pain, diarrhea and blood in stool.  Genitourinary: Negative for dysuria.  Musculoskeletal: Negative for back pain.  Neurological: Positive for weakness. Negative for dizziness, tremors and headaches.  Endo/Heme/Allergies: Does not bruise/bleed easily.    Tolerating Diet: Yes      DRUG ALLERGIES:  No Known Allergies  VITALS:  Blood pressure 107/34, pulse 66, temperature 98.5 F (36.9 C), temperature source Oral, resp. rate 19, height 5\' 2"  (1.575 m), weight 56.972 kg (125 lb 9.6 oz), SpO2 98 %.  PHYSICAL EXAMINATION:   Physical Exam  Constitutional: She is oriented to person, place, and time and well-developed, well-nourished, and in no distress. No distress.  HENT:  Head: Normocephalic.  Eyes: No scleral icterus.  Neck: Normal range of motion. Neck supple. No JVD present. No tracheal deviation present.  Cardiovascular: Normal rate, regular rhythm and normal heart sounds.  Exam reveals no gallop and no friction rub.   No murmur heard. Pulmonary/Chest: Effort normal and breath sounds normal. No respiratory distress. She has no wheezes. She has no rales. She exhibits no tenderness.  Abdominal: Soft. Bowel sounds are normal. She exhibits no distension and no mass. There is no tenderness. There is no rebound and no guarding.  Musculoskeletal:  Normal range of motion. She exhibits no edema.  Neurological: She is alert and oriented to person, place, and time.  Skin: Skin is warm. No rash noted. No erythema.  Psychiatric: Affect and judgment normal.      LABORATORY PANEL:   CBC  Recent Labs Lab 03/18/15 0418  WBC 2.8*  HGB 9.3*  HCT 27.7*  PLT 35*   ------------------------------------------------------------------------------------------------------------------  Chemistries   Recent Labs Lab 03/17/15 1148 03/18/15 0418  NA 136 138  K 4.4 4.0  CL 104 109  CO2 27 23  GLUCOSE 110* 80  BUN 40* 43*  CREATININE 2.19* 2.16*  CALCIUM 8.9 8.3*  AST 26  --   ALT 15  --   ALKPHOS 73  --   BILITOT 1.7*  --    ------------------------------------------------------------------------------------------------------------------  Cardiac Enzymes No results for input(s): TROPONINI in the last 168 hours. ------------------------------------------------------------------------------------------------------------------  RADIOLOGY:  Dg Chest Port 1 View  03/18/2015  CLINICAL DATA:  81 year old female with history of dyspnea. EXAM: PORTABLE CHEST 1 VIEW COMPARISON:  Chest x-ray 12/06/2014. FINDINGS: Low lung volumes. Skin fold artifact overlying the lower left hemithorax. No acute consolidative airspace disease. No pleural effusions. No evidence of pulmonary edema. Heart size is mildly enlarged. Upper mediastinal contours are grossly distorted by patient's rotation. Atherosclerosis in the thoracic aorta. IMPRESSION: 1. No radiographic evidence of acute cardiopulmonary disease. 2. Atherosclerosis. 3. Mild cardiomegaly. Electronically Signed   By: Vinnie Langton M.D.   On: 03/18/2015 09:16     ASSESSMENT AND PLAN:   81 year old female with a history of liver cirrhosis from EtOH who presented with weakness and found to have hypotension and acute on  chronic renal failure.   1. Acute renal failure on chronic kidney disease  stage III: This is likely secondary to volume depletion. Patient was also on Lasix and Aldactone at home which can cause renal failure with volume depletion. Patient will continue IV fluids. Appreciate nephrology consultation.  2. Hypotension: This is in the setting of volume depletion and use of Aldactone and Lasix. Blood pressure is improved with IV fluids.  3. Liver cirrhosis: Diuretics on hold due to problem #1 and 2. Continue lactulose and xifaxim  4. Thrombocytopenia: This is secondary to liver cirrhosis and is stable.    Management plans discussed with the patient and she is in agreement.  CODE STATUS: DNR  TOTAL TIME TAKING CARE OF THIS PATIENT: 30 minutes.     POSSIBLE D/C tomorrow, DEPENDING ON CLINICAL CONDITION. Discussed with nephrology and patient's family   Eh Sesay M.D on 03/18/2015 at 11:07 AM  Between 7am to 6pm - Pager - [phone] After 6pm go to [email or web address] - password [email or web address]  Office  [phone]  CC: Primary care physician; Dion Body, MD  Note: This dictation was prepared with Dragon dictation along with smaller phrase technology. Any transcriptional errors that result from this process are unintentional.

## 2015-03-18 NOTE — Plan of Care (Signed)
Problem: Safety: Goal: Ability to remain free from injury will improve Outcome: Progressing Pt understands how to call for assistance. Bed alarm currently on- refuses at times, family at bedside. +1 assist out of bed.  Problem: Pain Managment: Goal: General experience of comfort will improve Outcome: Progressing No c/o pain, resting comfortably in bed throughout the night.  Problem: Fluid Volume: Goal: Ability to maintain a balanced intake and output will improve Outcome: Progressing IVF infusing as ordered, remains hypotensive- MD aware. Will continue to assess.

## 2015-03-18 NOTE — Progress Notes (Signed)
Pt refuses bed alarm, family at bedside

## 2015-03-18 NOTE — Consult Note (Signed)
[facility]  [address], [place], [place] [address] Phone: [phone] Fax : [phone]  Consultation  Referring Provider:     No ref. provider found Primary Care Physician:  Dion Body, MD Primary Gastroenterologist:  Dr. Candace Cruise         Reason for Consultation:     Nausea  Date of Admission:  03/17/2015 Date of Consultation:  03/18/2015         HPI:   Susan May is a 81 y.o. female who was admitted with hypertension and nausea. The patient has a history of chronic nausea being worked up and evaluated by Dr. Candace Cruise and has been treated for this by Dr. Candace Cruise. The patient has a history of end-stage liver disease and has frequent paracentesis. She also has a history of urinary tract infections. The patient states that she has been nauseated for many months and has had increased nausea the last week or so. She states that the nausea is better today and she tolerated a by mouth diet. She also reports that the lactulose she takes makes her more nauseous.  Past Medical History  Diagnosis Date  . COPD (chronic obstructive pulmonary disease) ([place])   . Shortness of breath dyspnea   . Cancer ([place])     Hx: skin cancer x 2:  one melanoma      Past Surgical History  Procedure Laterality Date  . Appendectomy    . Vaginal hysterectomy    . Back surgery      Prior to Admission medications   Medication Sig Start Date End Date Taking? Authorizing Provider  albuterol (PROVENTIL HFA;VENTOLIN HFA) 108 (90 BASE) MCG/ACT inhaler Inhale 2 puffs into the lungs every 6 (six) hours as needed for wheezing or shortness of breath.   Yes Historical Provider, MD  fentaNYL (DURAGESIC - DOSED MCG/HR) 25 MCG/HR patch Place 25 mcg onto the skin every 3 (three) days.   Yes Historical Provider, MD  fluticasone (FLONASE) 50 MCG/ACT nasal spray Place 2 sprays into both nostrils daily as needed for rhinitis.    Yes Historical Provider, MD  furosemide (LASIX) 40 MG tablet Take 40 mg by mouth 2  (two) times daily.    Yes Historical Provider, MD  lactulose (CHRONULAC) 10 GM/15ML solution Take 30 g by mouth daily.    Yes Historical Provider, MD  loratadine (CLARITIN) 10 MG tablet Take 10 mg by mouth daily.   Yes Historical Provider, MD  metoCLOPramide (REGLAN) 5 MG tablet Take 5 mg by mouth 4 (four) times daily.   Yes Historical Provider, MD  nadolol (CORGARD) 20 MG tablet Take 20 mg by mouth daily.   Yes Historical Provider, MD  ondansetron (ZOFRAN-ODT) 8 MG disintegrating tablet Take 8 mg by mouth every 8 (eight) hours as needed for nausea or vomiting.   Yes Historical Provider, MD  pantoprazole (PROTONIX) 40 MG tablet Take 40 mg by mouth daily.   Yes Historical Provider, MD  rifaximin (XIFAXAN) 550 MG TABS tablet Take 550 mg by mouth 2 (two) times daily.   Yes Historical Provider, MD  spironolactone (ALDACTONE) 25 MG tablet Take 25 mg by mouth 2 (two) times daily.   Yes Historical Provider, MD    History reviewed. No pertinent family history.   Social History  Substance Use Topics  . Smoking status: Former Smoker -- 0.50 packs/day    Types: Cigarettes    Start date: 09/12/1968    Quit date: 09/12/1989  . Smokeless tobacco: Never Used  . Alcohol  Use: No    Allergies as of 03/17/2015  . (No Known Allergies)    Review of Systems:    All systems reviewed and negative except where noted in HPI.   Physical Exam:  Vital signs in last 24 hours: Temp:  [97.8 F (36.6 C)-98.6 F (37 C)] 98.5 F (36.9 C) (11/05 0546) Pulse Rate:  [53-66] 66 (11/05 0546) Resp:  [8-20] 19 (11/05 0546) BP: (74-115)/(31-54) 107/34 mmHg (11/05 0546) SpO2:  [97 %-100 %] 98 % (11/05 0546) Weight:  [125 lb 9.6 oz (56.972 kg)] 125 lb 9.6 oz (56.972 kg) (11/04 2125) Last BM Date: 03/16/15 General:   Pleasant, cooperative in NAD Head:  Normocephalic and atraumatic. Eyes:   Positive icterus.   Conjunctiva pink. PERRLA. Ears:  Normal auditory acuity. Neck:  Supple; no masses or thyroidomegaly Lungs:  Respirations even and unlabored. Lungs clear to auscultation bilaterally.   No wheezes, crackles, or rhonchi.  Heart:  Regular rate and rhythm;  Without murmur, clicks, rubs or gallops Abdomen:  Soft, distended, nontender. Normal bowel sounds. No appreciable masses or hepatomegaly.  No rebound or guarding.  Rectal:  Not performed. Msk:  Symmetrical without gross deformities.    Extremities:  Without edema, cyanosis or clubbing. Neurologic:  Alert and oriented x3;  grossly normal neurologically. Skin:  Intact without significant lesions or rashes. Cervical Nodes:  No significant cervical adenopathy. Psych:  Alert and cooperative. Normal affect.  LAB RESULTS:  Recent Labs  03/17/15 1148 03/18/15 0418  WBC 4.5 2.8*  HGB 12.1 9.3*  HCT 35.5 27.7*  PLT 54* 35*   BMET  Recent Labs  03/17/15 1148 03/18/15 0418  NA 136 138  K 4.4 4.0  CL 104 109  CO2 27 23  GLUCOSE 110* 80  BUN 40* 43*  CREATININE 2.19* 2.16*  CALCIUM 8.9 8.3*   LFT  Recent Labs  03/17/15 1148  PROT 5.3*  ALBUMIN 3.7  AST 26  ALT 15  ALKPHOS 73  BILITOT 1.7*   PT/INR No results for input(s): LABPROT, INR in the last 72 hours.  STUDIES: Dg Chest Port 1 View  03/18/2015  CLINICAL DATA:  81 year old female with history of dyspnea. EXAM: PORTABLE CHEST 1 VIEW COMPARISON:  Chest x-ray 12/06/2014. FINDINGS: Low lung volumes. Skin fold artifact overlying the lower left hemithorax. No acute consolidative airspace disease. No pleural effusions. No evidence of pulmonary edema. Heart size is mildly enlarged. Upper mediastinal contours are grossly distorted by patient's rotation. Atherosclerosis in the thoracic aorta. IMPRESSION: 1. No radiographic evidence of acute cardiopulmonary disease. 2. Atherosclerosis. 3. Mild cardiomegaly. Electronically Signed   By: Vinnie Langton M.D.   On: 03/18/2015 09:16      Impression / Plan:   Susan May is a 81 y.o. y/o female with a history of end-stage liver disease  with frequent paracenteses who has chronic nausea. The patient has been worked up by Dr. Candace Cruise for this and has been treated for this also by him. The patient states that the lactulose makes her more nauseous. I would continue treating the patient symptomatically. The patient should follow-up with Dr. Candace Cruise as an outpatient.  Thank you for involving me in the care of this patient.        Ollen Bowl, MD  03/18/2015, 1:12 PM   Note: This dictation was prepared with Dragon dictation along with smaller phrase technology. Any transcriptional errors that result from this process are unintentional.

## 2015-03-18 NOTE — Progress Notes (Signed)
Initial Nutrition Assessment     INTERVENTION:  Meals and snacks: Cater to pt preferences Medical Nutrition Supplement Therapy: will add mightyshake BID for added nutrition   NUTRITION DIAGNOSIS:   Inadequate oral intake related to acute illness as evidenced by per patient/family report.    GOAL:   Patient will meet greater than or equal to 90% of their needs    MONITOR:    (Energy intake, Anthropometric)  REASON FOR ASSESSMENT:   Malnutrition Screening Tool    ASSESSMENT:      Pt admitted with ARF on CKD stage III, hypotension, liver cirrhosis  Past Medical History  Diagnosis Date  . COPD (chronic obstructive pulmonary disease) ([place])   . Shortness of breath dyspnea   . Cancer ([place])     Hx: skin cancer x 2:  one melanoma      Current Nutrition: ate fruit and drank tea for lunch, ate 70% of breakfast   Food/Nutrition-Related History: pt reports appetite has been decreased for the past 2-3 days prior to admission   Scheduled Medications:  . fluticasone  1 spray Each Nare Daily  . lactulose  30 g Oral Daily  . loratadine  10 mg Oral Daily  . metoCLOPramide  5 mg Oral QID  . ondansetron  4 mg Oral 3 times per day  . pantoprazole  40 mg Oral Daily  . rifaximin  550 mg Oral BID  . sodium chloride  3 mL Intravenous Q12H    Continuous Medications:  . sodium chloride 50 mL/hr at 03/18/15 0546     Electrolyte/Renal Profile and Glucose Profile:   Recent Labs Lab 03/17/15 1148 03/18/15 0418  NA 136 138  K 4.4 4.0  CL 104 109  CO2 27 23  BUN 40* 43*  CREATININE 2.19* 2.16*  CALCIUM 8.9 8.3*  GLUCOSE 110* 80   Protein Profile:  Recent Labs Lab 03/17/15 1148  ALBUMIN 3.7    Gastrointestinal Profile: Last BM: 11/5   Nutrition-Focused Physical Exam Findings: Nutrition-Focused physical exam completed. Findings are normal fat depletion, normal to mild/moderate muscle depletion, and no edema.      Weight Change: 16% weight loss in the last 5  months, pt does report fluid weight loss.     Diet Order:  Diet regular Room service appropriate?: Yes; Fluid consistency:: Thin  Skin:   reviewed   Height:   Ht Readings from Last 1 Encounters:  03/17/15 5\' 2"  (1.575 m)    Weight:   Wt Readings from Last 1 Encounters:  03/17/15 125 lb 9.6 oz (56.972 kg)    Ideal Body Weight:     BMI:  Body mass index is 22.97 kg/(m^2).  Estimated Nutritional Needs:   Kcal:  BEE 988 kcals (IF 1.0-1.2, AF 1.3) 6546-5035 kcals/d.   Protein:  (1.0-1.2 g/kg) 57-68 g/d  Fluid:  (25-40ml/kg) 1425-1729ml/d  EDUCATION NEEDS:   No education needs identified at this time  [place]. Zenia Resides, [place], [place] (pager)

## 2015-03-18 NOTE — Progress Notes (Signed)
Physical Therapy Evaluation Patient Details Name: Susan May MRN: [id number] DOB: 1932/01/16 Today's Date: 03/18/2015   History of Present Illness  Pt. is an 81 year old female admitted to [facility] with nausea and HTN as well as end stage liver disease.  Clinical Impression  Patient does need assistance with supine to sit as well as transfers and gait/mobility tasks for safety. She did appear fatigued following ambulation; however, she did appear to be tired after walking 14 feet in the room. Cues were needed for safety as well as to avoid obstacles in the room with the walker. Patient may benefit from PT to address mobility, tasks, gait, strengthening and functional tasks while at [facility].     Follow Up Recommendations Home health PT (versus SNF dependent on progress)    Equipment Recommendations       Recommendations for Other Services       Precautions / Restrictions Precautions Precautions: Fall Restrictions Weight Bearing Restrictions: No      Mobility  Bed Mobility Overal bed mobility: Needs Assistance Bed Mobility: Supine to Sit;Sit to Supine     Supine to sit: Min assist Sit to supine: Supervision      Transfers Overall transfer level: Needs assistance Equipment used: Rolling walker (2 wheeled) Transfers: Sit to/from Stand Sit to Stand: Min assist         General transfer comment: Pt. did need cues to push from the bed/reach for the walker.   Ambulation/Gait Ambulation/Gait assistance: Min assist Ambulation Distance (Feet): 14 Feet Assistive device: Rolling walker (2 wheeled) Gait Pattern/deviations:  (decreased gait speed; pt. did need cueing to avoid obstacles)        Stairs            Wheelchair Mobility    Modified Rankin (Stroke Patients Only)       Balance                                             Pertinent Vitals/Pain Pain Assessment: No/denies pain    Home Living Family/patient expects to be discharged  to:: Private residence Living Arrangements: Children Available Help at Discharge: Family Type of Home: House Home Access: Stairs to enter   Technical brewer of Steps: 1 Home Layout: One level        Prior Function Level of Independence: Independent with assistive device(s)         Comments: Pt. reports that she mainly walked in her home, but at times, did use her walker outside.     Hand Dominance        Extremity/Trunk Assessment               Lower Extremity Assessment: Generalized weakness         Communication      Cognition Arousal/Alertness: Awake/alert Behavior During Therapy: WFL for tasks assessed/performed Overall Cognitive Status: Within Functional Limits for tasks assessed                      General Comments      Exercises        Assessment/Plan    PT Assessment Patient needs continued PT services  PT Diagnosis Generalized weakness;Abnormality of gait   PT Problem List    PT Treatment Interventions DME instruction;Gait training;Stair training;Functional mobility training;Therapeutic activities;Therapeutic exercise;Patient/family education   PT Goals (Current goals can be found  in the Care Plan section) Acute Rehab PT Goals Patient Stated Goal: To go home PT Goal Formulation: With patient Time For Goal Achievement: 04/01/15 Potential to Achieve Goals: Good    Frequency Min 2X/week   Barriers to discharge        Co-evaluation               End of Session Equipment Utilized During Treatment: Gait belt Activity Tolerance: Other (comment) (Pt. denied fatigue, but did seem tired after walking. ) Patient left: in bed;with bed alarm set      Functional Assessment Tool Used: clinical judgement Functional Limitation: Mobility: Walking and moving around Mobility: Walking and Moving Around Current Status (W5809): At least 60 percent but less than 80 percent impaired, limited or restricted Mobility: Walking and  Moving Around Goal Status [phone]): At least 40 percent but less than 60 percent impaired, limited or restricted    Time: 1525-1540 PT Time Calculation (min) (ACUTE ONLY): 15 min   Charges:   PT Evaluation $Initial PT Evaluation Tier I: 1 Procedure     PT G Codes:   PT G-Codes **NOT FOR INPATIENT CLASS** Functional Assessment Tool Used: clinical judgement Functional Limitation: Mobility: Walking and moving around Mobility: Walking and Moving Around Current Status (S5053): At least 60 percent but less than 80 percent impaired, limited or restricted Mobility: Walking and Moving Around Goal Status [phone]): At least 40 percent but less than 60 percent impaired, limited or restricted    Bertram Denver, PT, DPT, CWCE 03/18/2015, 4:57 PM

## 2015-03-19 DIAGNOSIS — I129 Hypertensive chronic kidney disease with stage 1 through stage 4 chronic kidney disease, or unspecified chronic kidney disease: Secondary | ICD-10-CM | POA: Diagnosis not present

## 2015-03-19 LAB — BASIC METABOLIC PANEL
Anion gap: 6 (ref 5–15)
BUN: 40 mg/dL — AB (ref 6–20)
CHLORIDE: 110 mmol/L (ref 101–111)
CO2: 23 mmol/L (ref 22–32)
Calcium: 8.6 mg/dL — ABNORMAL LOW (ref 8.9–10.3)
Creatinine, Ser: 2.01 mg/dL — ABNORMAL HIGH (ref 0.44–1.00)
GFR calc Af Amer: 26 mL/min — ABNORMAL LOW (ref >60)
GFR calc non Af Amer: 22 mL/min — ABNORMAL LOW (ref >60)
Glucose, Bld: 94 mg/dL (ref 65–99)
POTASSIUM: 4.3 mmol/L (ref 3.5–5.1)
SODIUM: 139 mmol/L (ref 135–145)

## 2015-03-19 MED ORDER — ONDANSETRON HCL 4 MG PO TABS
4.0000 mg | ORAL_TABLET | Freq: Three times a day (TID) | ORAL | Status: DC | PRN
Start: 2015-03-19 — End: 2015-03-19

## 2015-03-19 MED ORDER — SODIUM CHLORIDE 0.9 % IJ SOLN: 3.0000 mL | Freq: Two times a day (BID) | INTRAMUSCULAR | Status: DC

## 2015-03-19 NOTE — Progress Notes (Signed)
Dgt here ready to take pt. Oral and written AVS instructions given to pt and dgt with stated understanding; questions answered to their satisfaction. Susan May, case mgt, per TC advises [facility] referral done and in place. No prescriptions. Pt/family to call in a.m to make FU appts. Transported in [facility] to private vehicle with pt's care released to self and family.

## 2015-03-19 NOTE — Plan of Care (Signed)
Problem: Safety: Goal: Ability to remain free from injury will improve Outcome: Progressing Pt is a high fall Risk, encouraged to call for assistance when needed.  Problem: Pain Managment: Goal: General experience of comfort will improve Outcome: Progressing Pt does not c/o pain at this time. Disoriented to time, able to call for assistance when needed. Family at bedside in the evening.

## 2015-03-19 NOTE — Clinical Social Work Note (Signed)
Clinical Social Work Assessment  Patient Details  Name: Susan May MRN: [id number] Date of Birth: 01-28-1932  Date of referral:  03/19/15               Reason for consult:  Facility Placement, Other (Comment Required) (Medicare Observation )                Permission sought to share information with:  Case Manager Permission granted to share information::  Yes, Verbal Permission Granted  Name::        Agency::     Relationship::     Contact Information:     Housing/Transportation Living arrangements for the past 2 months:  Single Family Home Source of Information:  Patient Patient Interpreter Needed:  None Criminal Activity/Legal Involvement Pertinent to Current Situation/Hospitalization:  No - Comment as needed Significant Relationships:    Lives with:  Adult Children Do you feel safe going back to the place where you live?  Yes Need for family participation in patient care:  Yes (Comment)  Care giving concerns: Patient lives in [place] with her daughter Susan May.    Social Worker assessment / plan: Holiday representative (CSW) received SNF consult. PT is recommending Home Health VS. SNF. Patient is under medicare observation. CSW met with patient alone at bedside. Patient was sitting in bed and was alert and oriented. Patient reported that she lives in with her daughter Susan May in [place]. [place] explained that patient is under medicare observation status and does not have a payer for SNF. Patient reported that she has been to [facility] in the past. CSW explained that her Medicare will not pay for her to go to [facility] this time from the hospital. CSW explained that patient can pay privately for SNF. Patient verbalized her understanding and reported that she cannot pay privately for SNF. Patient is agreeable to home health. RN Case Manager aware of above and has arranged home health. Patient will D/C home today. Please reconsult if future social work needs arise. CSW signing off.   Employment status:   Disabled (Comment on whether or not currently receiving Disability), Retired Forensic scientist:  Medicare (Medicare Observation ) PT Recommendations:   (PT recommended home heatlh VS SNF) Information / Referral to community resources:  Other (Comment Required) (Case [place] )  Patient/Family's Response to care: Patient is agreeable to home health.   Patient/Family's Understanding of and Emotional Response to Diagnosis, Current Treatment, and Prognosis: Patient was pleasant throughout assessment.   Emotional Assessment Appearance:  Appears stated age Attitude/Demeanor/Rapport:    Affect (typically observed):  Accepting, Adaptable, Pleasant Orientation:  Oriented to Self, Oriented to Place, Oriented to  Time, Oriented to Situation Alcohol / Substance use:  Not Applicable Psych involvement (Current and /or in the community):  No (Comment)  Discharge Needs  Concerns to be addressed:  Discharge Planning Concerns Readmission within the last 30 days:  No Current discharge risk:  None Barriers to Discharge:  No Barriers Identified   Loralyn Freshwater, LCSW 03/19/2015, 10:46 AM

## 2015-03-19 NOTE — Progress Notes (Signed)
Subjective:  Doing fair today, although does not feel she is back to her usual self S Cr is slightly better No acute SOB Abdomen is distended   Objective:  Vital signs in last 24 hours:  Temp:  [98.1 F (36.7 C)-98.4 F (36.9 C)] 98.1 F (36.7 C) (11/06 0530) Pulse Rate:  [64-71] 71 (11/06 0530) Resp:  [18-19] 18 (11/06 0530) BP: (99-139)/(37-51) 139/51 mmHg (11/06 0530) SpO2:  [97 %-99 %] 98 % (11/06 0530)  Weight change:  Filed Weights   03/17/15 1140 03/17/15 2125  Weight: 60.782 kg (134 lb) 56.972 kg (125 lb 9.6 oz)    Intake/Output:    Intake/Output Summary (Last 24 hours) at 03/19/15 1049 Last data filed at 03/19/15 0903  Gross per 24 hour  Intake   2240 ml  Output      0 ml  Net   2240 ml     Physical Exam: General: NAD  HEENT Moist oral mucus membranes  Neck supple  Pulm/lungs Clear Ant, lat, normal effort  CVS/Heart Irregular, no rub  Abdomen:  Distended, soft  Extremities: Trace peripheral edema  Neurologic: Alert, able to follow commands  Skin: No acute rashes  Access:        Basic Metabolic Panel:   Recent Labs Lab 03/17/15 1148 03/18/15 0418 03/19/15 0353  NA 136 138 139  K 4.4 4.0 4.3  CL 104 109 110  CO2 27 23 23   GLUCOSE 110* 80 94  BUN 40* 43* 40*  CREATININE 2.19* 2.16* 2.01*  CALCIUM 8.9 8.3* 8.6*     CBC:  Recent Labs Lab 03/17/15 1148 03/18/15 0418  WBC 4.5 2.8*  HGB 12.1 9.3*  HCT 35.5 27.7*  MCV 100.1* 100.6*  PLT 54* 35*      Microbiology:  No results found for this or any previous visit (from the past 720 hour(s)).  Coagulation Studies: No results for input(s): LABPROT, INR in the last 72 hours.  Urinalysis:  Recent Labs  03/17/15 1821  COLORURINE YELLOW*  LABSPEC 1.009  PHURINE 5.0  GLUCOSEU NEGATIVE  HGBUR NEGATIVE  BILIRUBINUR NEGATIVE  KETONESUR NEGATIVE  PROTEINUR NEGATIVE  NITRITE NEGATIVE  LEUKOCYTESUR NEGATIVE      Imaging: Dg Chest Port 1 View  03/18/2015  CLINICAL DATA:   81 year old female with history of dyspnea. EXAM: PORTABLE CHEST 1 VIEW COMPARISON:  Chest x-ray 12/06/2014. FINDINGS: Low lung volumes. Skin fold artifact overlying the lower left hemithorax. No acute consolidative airspace disease. No pleural effusions. No evidence of pulmonary edema. Heart size is mildly enlarged. Upper mediastinal contours are grossly distorted by patient's rotation. Atherosclerosis in the thoracic aorta. IMPRESSION: 1. No radiographic evidence of acute cardiopulmonary disease. 2. Atherosclerosis. 3. Mild cardiomegaly. Electronically Signed   By: Vinnie Langton M.D.   On: 03/18/2015 09:16     Medications:     . fluticasone  1 spray Each Nare Daily  . lactulose  30 g Oral Daily  . loratadine  10 mg Oral Daily  . metoCLOPramide  5 mg Oral QID  . ondansetron  4 mg Oral 3 times per day  . pantoprazole  40 mg Oral Daily  . rifaximin  550 mg Oral BID  . sodium chloride  3 mL Intravenous Q12H  . sodium chloride  3 mL Intravenous Q12H   acetaminophen **OR** acetaminophen, benzonatate  Assessment/ Plan:  81 y.o. female with a PMHX of liver cirrhosis, ascites, COPD, esophagitis, chronic kidney disease, was admitted on 03/17/2015 with intractable nausea. Patient ate breakfast this  morning. Feels better.  1. Acute renal failure Likely secondary to volume fluctuations Patient was on furosemide, lactulose, spironolactone at home It may have caused volume depletion since patient's by mouth intake was very poor S Cr slightly improved May resume lasix in 2-3 days  2. Chronic kidney disease stage III Baseline creatinine 1.32/GFR 3.   3.Liver cirrhosis Diuretics on hold at present  F/u outpatient   LOS:  Susan May 11/6/201610:49 AM

## 2015-03-19 NOTE — Discharge Summary (Signed)
[facility] [place] NAME: Susan May    MR#:  [id number]  DATE OF BIRTH:  01/20/1932  DATE OF ADMISSION:  03/17/2015 ADMITTING PHYSICIAN: Theodoro Grist, MD  DATE OF DISCHARGE: March 29 2015 PRIMARY CARE PHYSICIAN: Dion Body, MD    ADMISSION DIAGNOSIS:  Nausea [R11.0] Dyspnea [R06.00] Hypotension, unspecified hypotension type [I95.9]  DISCHARGE DIAGNOSIS:  Principal Problem:   Hypotension Active Problems:   Acute on chronic renal failure (HCC)   Nausea   SECONDARY DIAGNOSIS:   Past Medical History  Diagnosis Date  . COPD (chronic obstructive pulmonary disease) ([place])   . Shortness of breath dyspnea   . Cancer ([place])     Hx: skin cancer x 2:  one melanoma      HOSPITAL COURSE:  81 year old female with a history of liver cirrhosis from EtOH who presented with weakness and found to have hypotension and acute on chronic renal failure.   1. Acute renal failure on chronic kidney disease stage III: This is likely secondary to volume depletion. Patient was also on Lasix and Aldactone at home which can cause renal failure with volume depletion.  Appreciate nephrology consultation. Dr Candiss Norse recommended discharging the patient on Aldactone and holding Lasix until patient is more able to tolerate a better diet. Her creatinine has improved and will need ongoing monitoring. She will need to avoid other nephrotoxic agents. She will have follow-up with Dr. Candiss Norse in 3-5 days  2. Hypotension: This is in the setting of volume depletion and use of Aldactone and Lasix. Blood pressure is improved with IV fluids.  3. Liver cirrhosis: Diuretics on hold due to problem #1 and 2. Continue lactulose and xifaxim. Patient receives weekly paracentesis next paracentesis about once a.  4. Thrombocytopenia: This is secondary to liver cirrhosis and is stable 5. Chronic nausea: Patient has had extensive workup with Dr. Candace Cruise in the past. Patient  was seen by Dr Allen Norris during his hospitalization for her chronic nausea and he recommended outpatient follow-up with Dr. Candace Cruise.  DISCHARGE CONDITIONS AND DIET:  Patient is being discharged in stable condition on a regular diet with monitoring of sodium and fluid intake  CONSULTS OBTAINED:  Treatment Team:  Murlean Iba, MD  DRUG ALLERGIES:  No Known Allergies  DISCHARGE MEDICATIONS:   Current Discharge Medication List    CONTINUE these medications which have NOT CHANGED   Details  albuterol (PROVENTIL HFA;VENTOLIN HFA) 108 (90 BASE) MCG/ACT inhaler Inhale 2 puffs into the lungs every 6 (six) hours as needed for wheezing or shortness of breath.    fentaNYL (DURAGESIC - DOSED MCG/HR) 25 MCG/HR patch Place 25 mcg onto the skin every 3 (three) days.    fluticasone (FLONASE) 50 MCG/ACT nasal spray Place 2 sprays into both nostrils daily as needed for rhinitis.     lactulose (CHRONULAC) 10 GM/15ML solution Take 30 g by mouth daily.     loratadine (CLARITIN) 10 MG tablet Take 10 mg by mouth daily.    metoCLOPramide (REGLAN) 5 MG tablet Take 5 mg by mouth 4 (four) times daily.    nadolol (CORGARD) 20 MG tablet Take 20 mg by mouth daily.    ondansetron (ZOFRAN-ODT) 8 MG disintegrating tablet Take 8 mg by mouth every 8 (eight) hours as needed for nausea or vomiting.    pantoprazole (PROTONIX) 40 MG tablet Take 40 mg by mouth daily.    rifaximin (XIFAXAN) 550 MG TABS tablet Take 550 mg by mouth 2 (two) times daily.  spironolactone (ALDACTONE) 25 MG tablet Take 25 mg by mouth 2 (two) times daily.      STOP taking these medications     furosemide (LASIX) 40 MG tablet      benzonatate (TESSALON) 100 MG capsule               Today   CHIEF COMPLAINT:  Patient is doing fairly well is moaning. She does have weakness. She is agreeable to home health.   VITAL SIGNS:  Blood pressure 139/51, pulse 71, temperature 98.1 F (36.7 C), temperature source Oral, resp. rate 18, height  5\' 2"  (1.575 m), weight 56.972 kg (125 lb 9.6 oz), SpO2 98 %.   REVIEW OF SYSTEMS:  Review of Systems  Constitutional: Negative for fever, chills and malaise/fatigue.  HENT: Negative for sore throat.   Eyes: Negative for blurred vision.  Respiratory: Negative for cough, hemoptysis, shortness of breath and wheezing.   Cardiovascular: Negative for chest pain, palpitations and leg swelling.  Gastrointestinal: Positive for nausea. Negative for vomiting, abdominal pain, diarrhea and blood in stool.  Genitourinary: Negative for dysuria.  Musculoskeletal: Negative for back pain.  Neurological: Positive for weakness. Negative for dizziness, tremors and headaches.  Endo/Heme/Allergies: Does not bruise/bleed easily.     PHYSICAL EXAMINATION:  GENERAL:  81 y.o.-year-old patient lying in the bed with no acute distress.  NECK:  Supple, no jugular venous distention. No thyroid enlargement, no tenderness.  LUNGS: Normal breath sounds bilaterally, no wheezing, rales,rhonchi  No use of accessory muscles of respiration.  CARDIOVASCULAR: S1, S2 normal. No murmurs, rubs, or gallops.  ABDOMEN: Soft, non-tender, mildly distended with no rebound or guarding. Bowel sounds present. Hard to appreciate organomegaly or mass.  EXTREMITIES: No pedal edema, cyanosis, or clubbing.  PSYCHIATRIC: The patient is alert and oriented x 3.  SKIN: No obvious rash, lesion, or ulcer.   DATA REVIEW:   CBC  Recent Labs Lab 03/18/15 0418  WBC 2.8*  HGB 9.3*  HCT 27.7*  PLT 35*    Chemistries   Recent Labs Lab 03/17/15 1148  03/19/15 0353  NA 136  < > 139  K 4.4  < > 4.3  CL 104  < > 110  CO2 27  < > 23  GLUCOSE 110*  < > 94  BUN 40*  < > 40*  CREATININE 2.19*  < > 2.01*  CALCIUM 8.9  < > 8.6*  AST 26  --   --   ALT 15  --   --   ALKPHOS 73  --   --   BILITOT 1.7*  --   --   < > = values in this interval not displayed.  Cardiac Enzymes No results for input(s): TROPONINI in the last 168  hours.  Microbiology Results  @MICRORSLT48 @  RADIOLOGY:  Dg Chest Port 1 View  03/18/2015  CLINICAL DATA:  81 year old female with history of dyspnea. EXAM: PORTABLE CHEST 1 VIEW COMPARISON:  Chest x-ray 12/06/2014. FINDINGS: Low lung volumes. Skin fold artifact overlying the lower left hemithorax. No acute consolidative airspace disease. No pleural effusions. No evidence of pulmonary edema. Heart size is mildly enlarged. Upper mediastinal contours are grossly distorted by patient's rotation. Atherosclerosis in the thoracic aorta. IMPRESSION: 1. No radiographic evidence of acute cardiopulmonary disease. 2. Atherosclerosis. 3. Mild cardiomegaly. Electronically Signed   By: Vinnie Langton M.D.   On: 03/18/2015 09:16      Management plans discussed with the patient and she in agreement. Stable for discharge   Patient  should follow up with PCP in 3-5 days and Dr Candiss Norse in 3-5 days Dr Blima Ledger 1 week  CODE STATUS:     Code Status Orders        Start     Ordered   03/17/15 2016  Do not attempt resuscitation (DNR)   Continuous    Question Answer Comment  In the event of cardiac or respiratory ARREST Do not call a "code blue"   In the event of cardiac or respiratory ARREST Do not perform Intubation, CPR, defibrillation or ACLS   In the event of cardiac or respiratory ARREST Use medication by any route, position, wound care, and other measures to relive pain and suffering. May use oxygen, suction and manual treatment of airway obstruction as needed for comfort.      03/17/15 2015    Advance Directive Documentation        Most Recent Value   Type of Advance Directive  Healthcare Power of Attorney   Pre-existing out of facility DNR order (yellow form or pink MOST form)     "MOST" Form in Place?        TOTAL TIME TAKING CARE OF THIS PATIENT: 35 minutes.    Note: This dictation was prepared with Dragon dictation along with smaller phrase technology. Any transcriptional errors that result  from this process are unintentional.  Samira Acero M.D on 03/19/2015 at 10:55 AM  Between 7am to 6pm - Pager - [phone] After 6pm go to [email or web address] - password [email or web address] [facility]  Office  [phone]  CC: Primary care physician; Dion Body, MD

## 2015-03-19 NOTE — Discharge Instructions (Signed)
You may resume LASIX after your follow up appointmentif your doctor says okay and you are eating better to make sure kidney function has improved.

## 2015-03-19 NOTE — Care Management Note (Signed)
Case Management Note  Patient Details  Name: IKEYA BROCKEL MRN: [id number] Date of Birth: [birth year]  Subjective/Objective:   Discussed discharge planning with Ms Virna Livengood. Ms Guzzetta stated that she has no preference of home health providers, has had home health in the past but cannot remember the provider. She chose [facility] from a list of local providers read to her by this Probation officer. A referral was called and faxed to [facility] requesting PT, RN, Aid.                Action/Plan:   Expected Discharge Date:                  Expected Discharge Plan:     In-House Referral:     Discharge planning Services     Post Acute Care Choice:    Choice offered to:     DME Arranged:    DME Agency:     HH Arranged:    [place] Agency:     Status of Service:     Medicare Important Message Given:    Date Medicare IM Given:    Medicare IM give by:    Date Additional Medicare IM Given:    Additional Medicare Important Message give by:     If discussed at [place] of Stay Meetings, dates discussed:    Additional Comments:  Roddy Bellamy A, RN 03/19/2015, 10:23 AM

## 2015-03-22 ENCOUNTER — Ambulatory Visit
Admission: RE | Admit: 2015-03-22 | Discharge: 2015-03-22 | Disposition: A | Discharge status: Home / Self Care | Payer: Medicare Other | Source: Ambulatory Visit | Attending: Gastroenterology | Admitting: Gastroenterology

## 2015-03-22 DIAGNOSIS — R188 Other ascites: Secondary | ICD-10-CM | POA: Diagnosis not present

## 2015-03-22 MED ORDER — ALBUMIN HUMAN 25 % IV SOLN
25.0000 g | Freq: Once | INTRAVENOUS | Status: AC
  Administered 2015-03-22: 25 g via INTRAVENOUS
  Filled 2015-03-22: qty 100

## 2015-03-22 NOTE — Discharge Instructions (Signed)
Paracentesis, Care After Refer to this sheet in the next few weeks. These instructions provide you with information about caring for yourself after your procedure. Your health care provider may also give you more specific instructions. Your treatment has been planned according to current medical practices, but problems sometimes occur. Call your health care provider if you have any problems or questions after your procedure. WHAT TO EXPECT AFTER THE PROCEDURE After your procedure, it is common to have a small amount of clear fluid coming from the puncture site. HOME CARE INSTRUCTIONS  Return to your normal activities as told by your health care provider. Ask your health care provider what activities are safe for you.  Take over-the-counter and prescription medicines only as told by your health care provider.  Do not take baths, swim, or use a hot tub until your health care provider approves.  Follow instructions from your health care provider about:  How to take care of your puncture site.  When and how you should change your bandage (dressing).  When you should remove your dressing.  Check your puncture area every day signs of infection. Watch for:  Redness, swelling, or pain.  Fluid, blood, or pus.  Keep all follow-up visits as told by your health care provider. This is important. SEEK MEDICAL CARE IF:  You have redness, swelling, or pain at your puncture site.  You start to have more clear fluid coming from your puncture site.  You have blood or pus coming from your puncture site.  You have chills.  You have a fever. SEEK IMMEDIATE MEDICAL CARE IF:  You develop chest pain or shortness of breath.  You develop increasing pain, discomfort, or swelling in your abdomen.  You feel dizzy or light-headed or you pass out.   This information is not intended to replace advice given to you by your health care provider. Make sure you discuss any questions you have with your health  care provider.   Document Released: 09/13/2014 Document Reviewed: 09/13/2014 Elsevier Interactive Patient Education 2016 [place].  Paracentesis, Care After Refer to this sheet in the next few weeks. These instructions provide you with information about caring for yourself after your procedure. Your health care provider may also give you more specific instructions. Your treatment has been planned according to current medical practices, but problems sometimes occur. Call your health care provider if you have any problems or questions after your procedure. WHAT TO EXPECT AFTER THE PROCEDURE After your procedure, it is common to have a small amount of clear fluid coming from the puncture site. HOME CARE INSTRUCTIONS  Return to your normal activities as told by your health care provider. Ask your health care provider what activities are safe for you.  Take over-the-counter and prescription medicines only as told by your health care provider.  Do not take baths, swim, or use a hot tub until your health care provider approves.  Follow instructions from your health care provider about:  How to take care of your puncture site.  When and how you should change your bandage (dressing).  When you should remove your dressing.  Check your puncture area every day signs of infection. Watch for:  Redness, swelling, or pain.  Fluid, blood, or pus.  Keep all follow-up visits as told by your health care provider. This is important. SEEK MEDICAL CARE IF:  You have redness, swelling, or pain at your puncture site.  You start to have more clear fluid coming from your puncture site.  You have blood or pus coming from your puncture site.  You have chills.  You have a fever. SEEK IMMEDIATE MEDICAL CARE IF:  You develop chest pain or shortness of breath.  You develop increasing pain, discomfort, or swelling in your abdomen.  You feel dizzy or light-headed or you pass out.   This  information is not intended to replace advice given to you by your health care provider. Make sure you discuss any questions you have with your health care provider.   Document Released: 09/13/2014 Document Reviewed: 09/13/2014 Elsevier Interactive Patient Education [employer].

## 2015-03-28 ENCOUNTER — Other Ambulatory Visit: Payer: Self-pay | Admitting: Radiology

## 2015-03-29 ENCOUNTER — Ambulatory Visit
Admission: RE | Admit: 2015-03-29 | Discharge: 2015-03-29 | Disposition: A | Discharge status: Home / Self Care | Payer: Medicare Other | Source: Ambulatory Visit | Attending: Gastroenterology | Admitting: Gastroenterology

## 2015-03-29 DIAGNOSIS — R188 Other ascites: Secondary | ICD-10-CM | POA: Diagnosis not present

## 2015-03-29 LAB — COMPREHENSIVE METABOLIC PANEL
ALT: 14 U/L (ref 14–54)
AST: 30 U/L (ref 15–41)
Albumin: 3.9 g/dL (ref 3.5–5.0)
Alkaline Phosphatase: 62 U/L (ref 38–126)
Anion gap: 7 (ref 5–15)
BUN: 45 mg/dL — AB (ref 6–20)
CHLORIDE: 105 mmol/L (ref 101–111)
CO2: 24 mmol/L (ref 22–32)
Calcium: 9.1 mg/dL (ref 8.9–10.3)
Creatinine, Ser: 1.93 mg/dL — ABNORMAL HIGH (ref 0.44–1.00)
GFR, EST AFRICAN AMERICAN: 27 mL/min — AB (ref >60)
GFR, EST NON AFRICAN AMERICAN: 23 mL/min — AB (ref >60)
Glucose, Bld: 125 mg/dL — ABNORMAL HIGH (ref 65–99)
POTASSIUM: 4.5 mmol/L (ref 3.5–5.1)
Sodium: 136 mmol/L (ref 135–145)
Total Bilirubin: 1.8 mg/dL — ABNORMAL HIGH (ref 0.3–1.2)
Total Protein: 5.8 g/dL — ABNORMAL LOW (ref 6.5–8.1)

## 2015-03-29 LAB — AMMONIA: AMMONIA: 61 umol/L — AB (ref 9–35)

## 2015-03-29 MED ORDER — ALBUMIN HUMAN 25 % IV SOLN
25.0000 g | Freq: Once | INTRAVENOUS | Status: DC
  Filled 2015-03-29: qty 100

## 2015-03-29 MED ORDER — ALBUMIN HUMAN 25 % IV SOLN
12.5000 g | Freq: Once | INTRAVENOUS | Status: AC
  Administered 2015-03-29: 12.5 g via INTRAVENOUS
  Filled 2015-03-29 (×2): qty 50

## 2015-03-29 NOTE — Procedures (Signed)
Successful US guided paracentesis yielding 5.8 L of serous ascitic fluid. No immediate post procedural complications.   Ronny Bacon, MD Pager #: [phone]

## 2015-03-29 NOTE — OR Nursing (Signed)
Pt family requested that we get order from Dr Candace Cruise to draw scheduled labs here, to be drawn from IV, instead of having her go to [facility] and getting stuck again. Order received for Ammonia and BMP, drawn from IV after Albumin infusion completed and sent to lab. Pt and daghter encouraged to call Dr Candace Cruise office tomorrow for results.

## 2015-04-04 ENCOUNTER — Other Ambulatory Visit: Payer: Self-pay | Admitting: Radiology

## 2015-04-05 ENCOUNTER — Ambulatory Visit
Admission: RE | Admit: 2015-04-05 | Discharge: 2015-04-05 | Disposition: A | Discharge status: Home / Self Care | Payer: Medicare Other | Source: Ambulatory Visit | Attending: Gastroenterology | Admitting: Gastroenterology

## 2015-04-05 DIAGNOSIS — R188 Other ascites: Secondary | ICD-10-CM | POA: Diagnosis not present

## 2015-04-05 MED ORDER — ALBUMIN HUMAN 25 % IV SOLN
25.0000 g | Freq: Once | INTRAVENOUS | Status: AC
  Administered 2015-04-05: 25 g via INTRAVENOUS
  Filled 2015-04-05: qty 100

## 2015-04-05 MED ORDER — ALBUMIN HUMAN 25 % IV SOLN
12.5000 g | Freq: Once | INTRAVENOUS | Status: AC
  Administered 2015-04-05: 12.5 g via INTRAVENOUS
  Filled 2015-04-05: qty 50

## 2015-04-12 ENCOUNTER — Ambulatory Visit
Admission: RE | Admit: 2015-04-12 | Discharge: 2015-04-12 | Disposition: A | Discharge status: Home / Self Care | Payer: Medicare Other | Source: Ambulatory Visit | Attending: Gastroenterology | Admitting: Gastroenterology

## 2015-04-12 DIAGNOSIS — R188 Other ascites: Secondary | ICD-10-CM | POA: Diagnosis not present

## 2015-04-12 MED ORDER — ALBUMIN HUMAN 25 % IV SOLN
25.0000 g | Freq: Once | INTRAVENOUS | Status: AC
  Administered 2015-04-12: 12.5 g via INTRAVENOUS
  Filled 2015-04-12: qty 100

## 2015-04-12 MED ORDER — ALBUMIN HUMAN 25 % IV SOLN
25.0000 g | Freq: Once | INTRAVENOUS | Status: AC
  Administered 2015-04-12: 25 g via INTRAVENOUS
  Filled 2015-04-12: qty 100

## 2015-04-12 NOTE — Procedures (Signed)
US guided paracentesis.  4.2 liters of yellow fluid removed without complication.

## 2015-04-19 ENCOUNTER — Ambulatory Visit
Admission: RE | Admit: 2015-04-19 | Discharge: 2015-04-19 | Disposition: A | Discharge status: Home / Self Care | Payer: Medicare Other | Source: Ambulatory Visit | Attending: Gastroenterology | Admitting: Gastroenterology

## 2015-04-26 ENCOUNTER — Ambulatory Visit
Admission: RE | Admit: 2015-04-26 | Discharge: 2015-04-26 | Disposition: A | Discharge status: Home / Self Care | Payer: Medicare Other | Source: Ambulatory Visit | Attending: Gastroenterology | Admitting: Gastroenterology

## 2015-04-26 MED ORDER — ALBUMIN HUMAN 25 % IV SOLN: 25.0000 g | Freq: Once | INTRAVENOUS | Status: DC

## 2015-04-26 NOTE — OR Nursing (Signed)
Pt called, she report she feels nauseated and poorly, she will not come in for procedure today.

## 2015-04-30 ENCOUNTER — Inpatient Hospital Stay
Admission: EM | Admit: 2015-04-30 | Discharge: 2015-05-02 | DRG: 441 | Disposition: A | Discharge status: Home / Self Care | Payer: Medicare Other | Attending: Internal Medicine | Admitting: Internal Medicine

## 2015-04-30 ENCOUNTER — Emergency Department: Payer: Medicare Other

## 2015-04-30 ENCOUNTER — Encounter: Payer: Self-pay | Admitting: Emergency Medicine

## 2015-04-30 DIAGNOSIS — Z9049 Acquired absence of other specified parts of digestive tract: Secondary | ICD-10-CM

## 2015-04-30 DIAGNOSIS — D696 Thrombocytopenia, unspecified: Secondary | ICD-10-CM | POA: Diagnosis present

## 2015-04-30 DIAGNOSIS — N189 Chronic kidney disease, unspecified: Secondary | ICD-10-CM | POA: Diagnosis present

## 2015-04-30 DIAGNOSIS — K7290 Hepatic failure, unspecified without coma: Secondary | ICD-10-CM

## 2015-04-30 DIAGNOSIS — Z807 Family history of other malignant neoplasms of lymphoid, hematopoietic and related tissues: Secondary | ICD-10-CM | POA: Diagnosis not present

## 2015-04-30 DIAGNOSIS — Z87891 Personal history of nicotine dependence: Secondary | ICD-10-CM

## 2015-04-30 DIAGNOSIS — E860 Dehydration: Secondary | ICD-10-CM | POA: Diagnosis present

## 2015-04-30 DIAGNOSIS — Z66 Do not resuscitate: Secondary | ICD-10-CM | POA: Diagnosis present

## 2015-04-30 DIAGNOSIS — K7581 Nonalcoholic steatohepatitis (NASH): Secondary | ICD-10-CM | POA: Diagnosis present

## 2015-04-30 DIAGNOSIS — G2581 Restless legs syndrome: Secondary | ICD-10-CM | POA: Diagnosis present

## 2015-04-30 DIAGNOSIS — J449 Chronic obstructive pulmonary disease, unspecified: Secondary | ICD-10-CM | POA: Diagnosis present

## 2015-04-30 DIAGNOSIS — K7682 Hepatic encephalopathy: Secondary | ICD-10-CM

## 2015-04-30 DIAGNOSIS — F419 Anxiety disorder, unspecified: Secondary | ICD-10-CM

## 2015-04-30 DIAGNOSIS — E43 Unspecified severe protein-calorie malnutrition: Secondary | ICD-10-CM | POA: Diagnosis present

## 2015-04-30 DIAGNOSIS — N179 Acute kidney failure, unspecified: Secondary | ICD-10-CM | POA: Diagnosis present

## 2015-04-30 DIAGNOSIS — Z85820 Personal history of malignant melanoma of skin: Secondary | ICD-10-CM

## 2015-04-30 DIAGNOSIS — R627 Adult failure to thrive: Secondary | ICD-10-CM | POA: Diagnosis present

## 2015-04-30 DIAGNOSIS — Z7951 Long term (current) use of inhaled steroids: Secondary | ICD-10-CM | POA: Diagnosis not present

## 2015-04-30 DIAGNOSIS — Z8249 Family history of ischemic heart disease and other diseases of the circulatory system: Secondary | ICD-10-CM | POA: Diagnosis not present

## 2015-04-30 DIAGNOSIS — R188 Other ascites: Secondary | ICD-10-CM | POA: Diagnosis present

## 2015-04-30 DIAGNOSIS — K219 Gastro-esophageal reflux disease without esophagitis: Secondary | ICD-10-CM | POA: Diagnosis present

## 2015-04-30 DIAGNOSIS — Z90710 Acquired absence of both cervix and uterus: Secondary | ICD-10-CM | POA: Diagnosis not present

## 2015-04-30 DIAGNOSIS — K7460 Unspecified cirrhosis of liver: Secondary | ICD-10-CM | POA: Diagnosis present

## 2015-04-30 DIAGNOSIS — Z9889 Other specified postprocedural states: Secondary | ICD-10-CM | POA: Diagnosis not present

## 2015-04-30 DIAGNOSIS — Z79899 Other long term (current) drug therapy: Secondary | ICD-10-CM | POA: Diagnosis not present

## 2015-04-30 DIAGNOSIS — Z6823 Body mass index (BMI) 23.0-23.9, adult: Secondary | ICD-10-CM

## 2015-04-30 HISTORY — DX: Unspecified cirrhosis of liver: K74.60

## 2015-04-30 HISTORY — DX: Dehydration: E86.0

## 2015-04-30 HISTORY — DX: Gastro-esophageal reflux disease without esophagitis: K21.9

## 2015-04-30 HISTORY — DX: Anxiety disorder, unspecified: F41.9

## 2015-04-30 HISTORY — DX: Hepatic failure, unspecified without coma: K72.90

## 2015-04-30 HISTORY — DX: Hepatic encephalopathy: K76.82

## 2015-04-30 LAB — COMPREHENSIVE METABOLIC PANEL
ALT: 15 U/L (ref 14–54)
AST: 26 U/L (ref 15–41)
Albumin: 3.6 g/dL (ref 3.5–5.0)
Alkaline Phosphatase: 82 U/L (ref 38–126)
Anion gap: 9 (ref 5–15)
BUN: 45 mg/dL — AB (ref 6–20)
CHLORIDE: 103 mmol/L (ref 101–111)
CO2: 26 mmol/L (ref 22–32)
CREATININE: 2.21 mg/dL — AB (ref 0.44–1.00)
Calcium: 9.1 mg/dL (ref 8.9–10.3)
GFR calc non Af Amer: 20 mL/min — ABNORMAL LOW (ref >60)
GFR, EST AFRICAN AMERICAN: 23 mL/min — AB (ref >60)
Glucose, Bld: 166 mg/dL — ABNORMAL HIGH (ref 65–99)
Potassium: 3.8 mmol/L (ref 3.5–5.1)
SODIUM: 138 mmol/L (ref 135–145)
Total Bilirubin: 1.7 mg/dL — ABNORMAL HIGH (ref 0.3–1.2)
Total Protein: 5.9 g/dL — ABNORMAL LOW (ref 6.5–8.1)

## 2015-04-30 LAB — URINALYSIS COMPLETE WITH MICROSCOPIC (ARMC ONLY)
BILIRUBIN URINE: NEGATIVE
Glucose, UA: NEGATIVE mg/dL
Hgb urine dipstick: NEGATIVE
KETONES UR: NEGATIVE mg/dL
NITRITE: NEGATIVE
PROTEIN: NEGATIVE mg/dL
Specific Gravity, Urine: 1.013 (ref 1.005–1.030)
pH: 5 (ref 5.0–8.0)

## 2015-04-30 LAB — LACTIC ACID, PLASMA
Lactic Acid, Venous: 2.4 mmol/L (ref 0.5–2.0)
Lactic Acid, Venous: 1.8 mmol/L (ref 0.5–2.0)

## 2015-04-30 LAB — TROPONIN I: Troponin I: <0.03 ng/mL (ref <0.031)

## 2015-04-30 LAB — CBC WITH DIFFERENTIAL/PLATELET
Basophils Absolute: 0 10*3/uL (ref 0–0.1)
Basophils Relative: 1 %
Eosinophils Absolute: 0.2 10*3/uL (ref 0–0.7)
HEMATOCRIT: 38.9 % (ref 35.0–47.0)
Hemoglobin: 13.1 g/dL (ref 12.0–16.0)
LYMPHS ABS: 0.9 10*3/uL — AB (ref 1.0–3.6)
MCH: 33.5 pg (ref 26.0–34.0)
MCHC: 33.6 g/dL (ref 32.0–36.0)
MCV: 99.7 fL (ref 80.0–100.0)
Monocytes Absolute: 0.8 10*3/uL (ref 0.2–0.9)
Monocytes Relative: 16 %
Neutro Abs: 2.8 10*3/uL (ref 1.4–6.5)
Neutrophils Relative %: 60 %
PLATELETS: 53 10*3/uL — AB (ref 150–440)
RBC: 3.9 MIL/uL (ref 3.80–5.20)
RDW: 14.5 % (ref 11.5–14.5)
WBC: 4.7 10*3/uL (ref 3.6–11.0)

## 2015-04-30 LAB — AMMONIA: Ammonia: 86 umol/L — ABNORMAL HIGH (ref 9–35)

## 2015-04-30 LAB — LIPASE, BLOOD: Lipase: 39 U/L (ref 11–51)

## 2015-04-30 MED ORDER — FLUOXETINE HCL 10 MG PO CAPS
10.0000 mg | ORAL_CAPSULE | Freq: Every day | ORAL | Status: DC
  Administered 2015-05-01 – 2015-05-02 (×2): 10 mg via ORAL
  Filled 2015-04-30 (×3): qty 1

## 2015-04-30 MED ORDER — LACTULOSE 10 GM/15ML PO SOLN
30.0000 g | Freq: Three times a day (TID) | ORAL | Status: DC
  Administered 2015-05-01 – 2015-05-02 (×4): 30 g via ORAL
  Filled 2015-04-30 (×4): qty 60

## 2015-04-30 MED ORDER — FENTANYL 12 MCG/HR TD PT72
25.0000 ug | MEDICATED_PATCH | TRANSDERMAL | Status: DC
  Administered 2015-05-01: 25 ug via TRANSDERMAL
  Filled 2015-04-30: qty 2

## 2015-04-30 MED ORDER — RIFAXIMIN 550 MG PO TABS
550.0000 mg | ORAL_TABLET | Freq: Two times a day (BID) | ORAL | Status: DC
  Administered 2015-05-01 – 2015-05-02 (×3): 550 mg via ORAL
  Filled 2015-04-30 (×3): qty 1

## 2015-04-30 MED ORDER — ONDANSETRON HCL 4 MG/2ML IJ SOLN: 4.0000 mg | Freq: Four times a day (QID) | INTRAMUSCULAR | Status: DC | PRN

## 2015-04-30 MED ORDER — ONDANSETRON HCL 4 MG PO TABS: 4.0000 mg | ORAL_TABLET | Freq: Four times a day (QID) | ORAL | Status: DC | PRN

## 2015-04-30 MED ORDER — SODIUM CHLORIDE 0.9 % IV BOLUS (SEPSIS)
500.0000 mL | Freq: Once | INTRAVENOUS | Status: AC
  Administered 2015-04-30: 500 mL via INTRAVENOUS

## 2015-04-30 MED ORDER — ALBUTEROL SULFATE (2.5 MG/3ML) 0.083% IN NEBU: 3.0000 mL | INHALATION_SOLUTION | Freq: Four times a day (QID) | RESPIRATORY_TRACT | Status: DC | PRN

## 2015-04-30 MED ORDER — LACTULOSE 10 GM/15ML PO SOLN: 10.0000 g | Freq: Once | ORAL | Status: DC

## 2015-04-30 MED ORDER — METOCLOPRAMIDE HCL 5 MG PO TABS
5.0000 mg | ORAL_TABLET | Freq: Four times a day (QID) | ORAL | Status: DC
  Administered 2015-05-01 – 2015-05-02 (×5): 5 mg via ORAL
  Filled 2015-04-30 (×5): qty 1

## 2015-04-30 MED ORDER — PANTOPRAZOLE SODIUM 40 MG PO TBEC
40.0000 mg | DELAYED_RELEASE_TABLET | Freq: Every day | ORAL | Status: DC
  Administered 2015-05-01 – 2015-05-02 (×2): 40 mg via ORAL
  Filled 2015-04-30 (×2): qty 1

## 2015-04-30 MED ORDER — LACTULOSE 10 GM/15ML PO SOLN
30.0000 g | Freq: Once | ORAL | Status: AC
  Administered 2015-04-30: 30 g via ORAL
  Filled 2015-04-30: qty 60

## 2015-04-30 MED ORDER — SPIRONOLACTONE 25 MG PO TABS
25.0000 mg | ORAL_TABLET | Freq: Two times a day (BID) | ORAL | Status: DC
  Administered 2015-05-01: 25 mg via ORAL
  Filled 2015-04-30: qty 1

## 2015-04-30 MED ORDER — SODIUM CHLORIDE 0.9 % IV SOLN
INTRAVENOUS | Status: AC
  Administered 2015-04-30: 22:00:00 via INTRAVENOUS

## 2015-04-30 MED ORDER — SODIUM CHLORIDE 0.9 % IJ SOLN
3.0000 mL | Freq: Two times a day (BID) | INTRAMUSCULAR | Status: DC
  Administered 2015-05-01: 3 mL via INTRAVENOUS

## 2015-04-30 NOTE — ED Provider Notes (Signed)
[facility] Emergency Department Provider Note    ____________________________________________  Time seen: 1710  I have reviewed the triage vital signs and the nursing notes.   HISTORY  Chief Complaint Failure To Thrive   History limited by: Not Limited, some history obtained from daughter.    HPI Susan May is a 81 y.o. female who presents to the emergency department today because of concerns for increased weakness. The patient daughter states patient has been having increased weakness for the past couple months. For the past 4 days appears to been worse. Patient did have a fall earlier today. Patient not complaining of any traumatic injuries. The daughter states however that she has noticed a change in the coloration of the patient's urine. Patient denies any pain or burning with urination. Patient denies any abdominal pain. Denies any chest pain or shortness of breath. Denies any recent fevers. The patient usually gets weekly paracentesis however has skipped her past 2 weeks because she doesn't like the pain of the needle.   Past Medical History  Diagnosis Date  . COPD (chronic obstructive pulmonary disease) ([place])   . Shortness of breath dyspnea   . Cancer ([place])     Hx: skin cancer x 2:  one melanoma      Patient Active Problem List   Diagnosis Date Noted  . Nausea   . Hypotension 03/17/2015  . Acute on chronic renal failure ([place]) 03/17/2015  . Cirrhosis ([place])   . NASH (nonalcoholic steatohepatitis)     Past Surgical History  Procedure Laterality Date  . Appendectomy    . Vaginal hysterectomy    . Back surgery      Current Outpatient Rx  Name  Route  Sig  Dispense  Refill  . albuterol (PROVENTIL HFA;VENTOLIN HFA) 108 (90 BASE) MCG/ACT inhaler   Inhalation   Inhale 2 puffs into the lungs every 6 (six) hours as needed for wheezing or shortness of breath.         . fentaNYL (DURAGESIC - DOSED MCG/HR) 25 MCG/HR patch   Transdermal    Place 25 mcg onto the skin every 3 (three) days.         . fluticasone (FLONASE) 50 MCG/ACT nasal spray   Each Nare   Place 2 sprays into both nostrils daily as needed for rhinitis.          . furosemide (LASIX) 40 MG tablet   Oral   Take 40 mg by mouth daily.         Marland Kitchen gabapentin (NEURONTIN) 100 MG capsule   Oral   Take 100 mg by mouth at bedtime.         Marland Kitchen lactulose (CHRONULAC) 10 GM/15ML solution   Oral   Take 30 g by mouth daily.          Marland Kitchen loratadine (CLARITIN) 10 MG tablet   Oral   Take 10 mg by mouth daily.         . metoCLOPramide (REGLAN) 5 MG tablet   Oral   Take 5 mg by mouth 4 (four) times daily.         . nadolol (CORGARD) 20 MG tablet   Oral   Take 20 mg by mouth daily.         . ondansetron (ZOFRAN-ODT) 8 MG disintegrating tablet   Oral   Take 8 mg by mouth every 8 (eight) hours as needed for nausea or vomiting.         Marland Kitchen  pantoprazole (PROTONIX) 40 MG tablet   Oral   Take 40 mg by mouth daily.         . rifaximin (XIFAXAN) 550 MG TABS tablet   Oral   Take 550 mg by mouth 2 (two) times daily.         Marland Kitchen spironolactone (ALDACTONE) 25 MG tablet   Oral   Take 25 mg by mouth 2 (two) times daily.           Allergies Review of patient's allergies indicates no known allergies.  No family history on file.  Social History Social History  Substance Use Topics  . Smoking status: Former Smoker -- 0.50 packs/day    Types: Cigarettes    Start date: 09/12/1968    Quit date: 09/12/1989  . Smokeless tobacco: Never Used  . Alcohol Use: No    Review of Systems  Constitutional: Negative for fever. Cardiovascular: Negative for chest pain. Respiratory: Negative for shortness of breath. Gastrointestinal: Negative for abdominal pain, vomiting and diarrhea. Genitourinary: Negative for dysuria. Neurological: Negative for headaches, focal weakness or numbness.  10-point ROS otherwise  negative.  ____________________________________________   PHYSICAL EXAM:  VITAL SIGNS: ED Triage Vitals  Enc Vitals Group     BP 04/30/15 1635 117/76 mmHg     Pulse Rate 04/30/15 1635 101     Resp 04/30/15 1635 13     Temp 04/30/15 1635 98.2 F (36.8 C)     Temp Source 04/30/15 1635 Oral     SpO2 04/30/15 1635 100 %     Weight 04/30/15 1635 138 lb (62.596 kg)     Height 04/30/15 1635 5\' 3"  (1.6 m)     Head Cir --      Peak Flow --      Pain Score 04/30/15 1637 0   Constitutional: Alert and oriented. Well appearing and in no distress. Eyes: Conjunctivae are normal. PERRL. Normal extraocular movements. ENT   Head: Normocephalic and atraumatic.   Nose: No congestion/rhinnorhea.   Mouth/Throat: Mucous membranes are moist.   Neck: No stridor. Hematological/Lymphatic/Immunilogical: No cervical lymphadenopathy. Cardiovascular: Tachycardic, regular rhythm.  No murmurs, rubs, or gallops. Respiratory: Normal respiratory effort without tachypnea nor retractions. Breath sounds are clear and equal bilaterally. No wheezes/rales/rhonchi. Gastrointestinal: Soft and nontender. No distention. There is no CVA tenderness. Genitourinary: Deferred Musculoskeletal: Normal range of motion in all extremities. No joint effusions.  No lower extremity tenderness nor edema. Neurologic:  Normal speech and language. No gross focal neurologic deficits are appreciated.  Skin:  Skin is warm, dry and intact. No rash noted. Psychiatric: Mood and affect are normal. Speech and behavior are normal. Patient exhibits appropriate insight and judgment.  ____________________________________________    LABS (pertinent positives/negatives)  Labs Reviewed  COMPREHENSIVE METABOLIC PANEL - Abnormal; Notable for the following:    Glucose, Bld 166 (*)    BUN 45 (*)    Creatinine, Ser 2.21 (*)    Total Protein 5.9 (*)    Total Bilirubin 1.7 (*)    GFR calc non Af Amer 20 (*)    GFR calc Af Amer 23 (*)     All other components within normal limits  CBC WITH DIFFERENTIAL/PLATELET - Abnormal; Notable for the following:    Platelets 53 (*)    Lymphs Abs 0.9 (*)    All other components within normal limits  LACTIC ACID, PLASMA - Abnormal; Notable for the following:    Lactic Acid, Venous 2.4 (*)    All other components within normal  limits  AMMONIA - Abnormal; Notable for the following:    Ammonia 86 (*)    All other components within normal limits  TROPONIN I  LIPASE, BLOOD  URINALYSIS COMPLETEWITH MICROSCOPIC ([facility] ONLY)  LACTIC ACID, PLASMA     ____________________________________________   EKG  I, Nance Pear, attending physician, personally viewed and interpreted this EKG  EKG Time: 1629 Rate: 134 Rhythm: sinus tachycardia Axis: left axis deviation Intervals: qtc 522 QRS: narrow, q waves, II, III, aVF ST changes: no st elevation Impression: abnormal ekg ____________________________________________    RADIOLOGY  CXR IMPRESSION: Radiographically stable appearance of the chest with mild bibasilar atelectasis related to low lung volumes.  I, Davian Wollenberg, personally viewed and evaluated these images (plain radiographs) as part of my medical decision making. ____________________________________________   PROCEDURES  Procedure(s) performed: None  Critical Care performed: No  ____________________________________________   INITIAL IMPRESSION / ASSESSMENT AND PLAN / ED COURSE  Pertinent labs & imaging results that were available during my care of the patient were reviewed by me and considered in my medical decision making (see chart for details).  Patient presented to the emergency department today brought in by family because of concerns for increasing fatigue and falls. The patient's ammonia is elevated and patient does have history of cirrhosis. This raises the concern for hepatic encephalopathy. Will plan on admission to the hospital  service.  ____________________________________________   FINAL CLINICAL IMPRESSION(S) / ED DIAGNOSES  Final diagnoses:  Hepatic encephalopathy ([place])     Nance Pear, MD 04/30/15 1907

## 2015-04-30 NOTE — H&P (Signed)
[facility] [place] NAME: Lamara Nissen    MR#:  [id number]  DATE OF BIRTH:  [birth year]  DATE OF ADMISSION:  04/30/2015  PRIMARY CARE PHYSICIAN: Dion Body, MD   REQUESTING/REFERRING PHYSICIAN: Archie Balboa, MD  CHIEF COMPLAINT:   Chief Complaint  Patient presents with  . Failure To Thrive    HISTORY OF PRESENT ILLNESS:  Susan May  is a 81 y.o. female who presents with decreased mentation and increasing weakness for the past 2 days. Patient's daughters present with her in the ED and provides most of the history. She states that her mother has had decreased by mouth intake and increasing weakness and episodes of confusion over the past 2 days. She states that in general the patient often does not like to take her lactulose, but with her poor by mouth intake over the past couple days has almost definitely not been taking it as she should. On evaluation in the ED her ammonia is elevated. She also has some elevation in her renal function. Hospitalists were called for admission for hepatic encephalopathy.  PAST MEDICAL HISTORY:   Past Medical History  Diagnosis Date  . COPD (chronic obstructive pulmonary disease) ([place])   . Shortness of breath dyspnea   . Cancer ([place])     Hx: skin cancer x 2:  one melanoma    . Cirrhosis of liver ([place])   . GERD (gastroesophageal reflux disease)   . Anxiety     PAST SURGICAL HISTORY:   Past Surgical History  Procedure Laterality Date  . Appendectomy    . Vaginal hysterectomy    . Back surgery      SOCIAL HISTORY:   Social History  Substance Use Topics  . Smoking status: Former Smoker -- 0.50 packs/day    Types: Cigarettes    Start date: 09/12/1968    Quit date: 09/12/1989  . Smokeless tobacco: Never Used  . Alcohol Use: No    FAMILY HISTORY:   Family History  Problem Relation Age of Onset  . Hypertension Mother   . Angina Mother   . Heart attack Mother   . Prostate cancer  Father   . Lymphoma Sister   . Heart attack Sister     DRUG ALLERGIES:  No Known Allergies  MEDICATIONS AT HOME:   Prior to Admission medications   Medication Sig Start Date End Date Taking? Authorizing Provider  albuterol (PROVENTIL HFA;VENTOLIN HFA) 108 (90 BASE) MCG/ACT inhaler Inhale 2 puffs into the lungs every 6 (six) hours as needed for wheezing or shortness of breath.   Yes Historical Provider, MD  ALPRAZolam (XANAX) 0.25 MG tablet Take 0.25 mg by mouth 2 (two) times daily as needed. For anxiety and restless legs. 03/21/15  Yes Historical Provider, MD  fentaNYL (DURAGESIC - DOSED MCG/HR) 25 MCG/HR patch Place 25 mcg onto the skin every 3 (three) days.   Yes Historical Provider, MD  FLUoxetine (PROZAC) 10 MG capsule Take 10 mg by mouth daily. 04/15/15  Yes Historical Provider, MD  fluticasone (FLONASE) 50 MCG/ACT nasal spray Place 2 sprays into both nostrils daily as needed for rhinitis.    Yes Historical Provider, MD  furosemide (LASIX) 40 MG tablet Take 40 mg by mouth daily.   Yes Historical Provider, MD  lactulose (CHRONULAC) 10 GM/15ML solution Take 30 g by mouth 3 (three) times daily.    Yes Historical Provider, MD  loratadine (CLARITIN) 10 MG tablet Take 10 mg by mouth daily.  Yes Historical Provider, MD  metoCLOPramide (REGLAN) 5 MG tablet Take 5 mg by mouth 4 (four) times daily.   Yes Historical Provider, MD  ondansetron (ZOFRAN-ODT) 8 MG disintegrating tablet Take 8 mg by mouth every 8 (eight) hours as needed for nausea or vomiting.   Yes Historical Provider, MD  pantoprazole (PROTONIX) 40 MG tablet Take 40 mg by mouth daily.   Yes Historical Provider, MD  rifaximin (XIFAXAN) 550 MG TABS tablet Take 550 mg by mouth 2 (two) times daily.   Yes Historical Provider, MD  spironolactone (ALDACTONE) 25 MG tablet Take 25 mg by mouth 2 (two) times daily.   Yes Historical Provider, MD    REVIEW OF SYSTEMS:  Review of Systems  Unable to perform ROS: mental status change     VITAL  SIGNS:   Filed Vitals:   04/30/15 1730 04/30/15 1800 04/30/15 1830 04/30/15 1900  BP: 103/74 122/73 104/68 113/80  Pulse: 130 92 92 132  Temp:      TempSrc:      Resp: 12 14 15 17   Height:      Weight:      SpO2: 100% 99% 100% 99%   Wt Readings from Last 3 Encounters:  04/30/15 62.596 kg (138 lb)  03/17/15 56.972 kg (125 lb 9.6 oz)  02/16/15 60.782 kg (134 lb)    PHYSICAL EXAMINATION:  Physical Exam  Vitals reviewed. Constitutional: She is oriented to person, place, and time. She appears well-developed and well-nourished. No distress.  HENT:  Head: Normocephalic and atraumatic.  Mouth/Throat: Oropharynx is clear and moist.  Eyes: Conjunctivae and EOM are normal. Pupils are equal, round, and reactive to light. No scleral icterus.  Neck: Normal range of motion. Neck supple. No JVD present. No thyromegaly present.  Cardiovascular: Normal rate, regular rhythm and intact distal pulses.  Exam reveals no gallop and no friction rub.   No murmur heard. Respiratory: Effort normal and breath sounds normal. No respiratory distress. She has no wheezes. She has no rales.  GI: Soft. Bowel sounds are normal. She exhibits distension. There is no tenderness.  Musculoskeletal: Normal range of motion. She exhibits no edema.  Asterixis present. No arthritis, no gout  Lymphadenopathy:    She has no cervical adenopathy.  Neurological: She is alert and oriented to person, place, and time. No cranial nerve deficit.  No dysarthria, no aphasia  Skin: Skin is warm and dry. No rash noted. No erythema.  Psychiatric:  Unable to assess due to patient's condition.    LABORATORY PANEL:   CBC  Recent Labs Lab 04/30/15 1645  WBC 4.7  HGB 13.1  HCT 38.9  PLT 53*   ------------------------------------------------------------------------------------------------------------------  Chemistries   Recent Labs Lab 04/30/15 1645  NA 138  K 3.8  CL 103  CO2 26  GLUCOSE 166*  BUN 45*  CREATININE  2.21*  CALCIUM 9.1  AST 26  ALT 15  ALKPHOS 82  BILITOT 1.7*   ------------------------------------------------------------------------------------------------------------------  Cardiac Enzymes  Recent Labs Lab 04/30/15 1645  TROPONINI <0.03   ------------------------------------------------------------------------------------------------------------------  RADIOLOGY:  Dg Chest 1 View  04/30/2015  CLINICAL DATA:  Increasing lethargy today.  History of cirrhosis. EXAM: CHEST 1 VIEW COMPARISON:  03/18/2015 and 12/06/2014. FINDINGS: 1714 hours. There are persistent low lung volumes with stable mild bibasilar atelectasis. No edema, confluent airspace opacity, pleural effusion or pneumothorax. The heart size and mediastinal contours are stable. The bones appear unchanged. IMPRESSION: Radiographically stable appearance of the chest with mild bibasilar atelectasis related to low lung  volumes. Electronically Signed   By: Richardean Sale M.D.   On: 04/30/2015 17:37    EKG:   Orders placed or performed during the hospital encounter of 12/06/14  . ED EKG  . ED EKG  . EKG    IMPRESSION AND PLAN:  Principal Problem:   Encephalopathy, hepatic (HCC) - restart patient's lactulose. Continue her other home meds, specifically rifaximin and spironolactone. Monitor for improvement. Active Problems:   Cirrhosis ([place]) - continue home meds. Patient states she gets paracentesis every Wednesday, but has missed the last 2. Abdomen is soft today, though with the fluid she may need to get (see below) we will monitor her for possible paracentesis tomorrow if needed.   Acute on chronic renal failure (HCC) - gentle fluids and monitor closely. Avoid nephrotoxins.   Dehydration - due to decreased by mouth intake, will give gentle fluids tonight and reassess in the morning.   Anxiety - continue home meds   GERD (gastroesophageal reflux disease) - equivalent home dose PPI  All the records are reviewed and  case discussed with ED provider. Management plans discussed with the patient and/or family.  DVT PROPHYLAXIS: Mechanical only due to chronic thrombocytopenia  GI PROPHYLAXIS: PPI  ADMISSION STATUS: Inpatient  CODE STATUS: DO NOT RESUSCITATE Advance Directive Documentation        Most Recent Value   Type of Advance Directive  Out of facility DNR (pink MOST or yellow form), Healthcare Power of Attorney   Pre-existing out of facility DNR order (yellow form or pink MOST form)  Yellow form placed in chart (order not valid for inpatient use)   "MOST" Form in Place?        TOTAL TIME TAKING CARE OF THIS PATIENT: 40 minutes.    [employer] [place] FIELDING 04/30/2015, 7:32 PM  [facility]  Office  [phone]  CC: Primary care physician; Dion Body, MD

## 2015-04-30 NOTE — ED Notes (Signed)
Pt to ED via EMS from home, family called transport d/t fall this morning and increased lethargy throughout the day. Pt has history of liver cirrhosis, pt is supposed to have paracentesis every Wednesday but has refused to go for last 2 visits. Pt's PCP told pt to come in Friday d/t missing visits but she did not want to come in then. Pt is lethargic, Ox3, disoriented to time only, family states confusion is normal for her when her ammonia level is high.

## 2015-05-01 ENCOUNTER — Inpatient Hospital Stay: Payer: Medicare Other

## 2015-05-01 LAB — BASIC METABOLIC PANEL
ANION GAP: 10 (ref 5–15)
BUN: 42 mg/dL — ABNORMAL HIGH (ref 6–20)
CALCIUM: 8.8 mg/dL — AB (ref 8.9–10.3)
CO2: 24 mmol/L (ref 22–32)
CREATININE: 2 mg/dL — AB (ref 0.44–1.00)
Chloride: 105 mmol/L (ref 101–111)
GFR, EST AFRICAN AMERICAN: 26 mL/min — AB (ref >60)
GFR, EST NON AFRICAN AMERICAN: 22 mL/min — AB (ref >60)
Glucose, Bld: 107 mg/dL — ABNORMAL HIGH (ref 65–99)
Potassium: 3.6 mmol/L (ref 3.5–5.1)
SODIUM: 139 mmol/L (ref 135–145)

## 2015-05-01 LAB — CBC
HCT: 35 % (ref 35.0–47.0)
HEMOGLOBIN: 11.9 g/dL — AB (ref 12.0–16.0)
MCH: 33.9 pg (ref 26.0–34.0)
MCHC: 33.9 g/dL (ref 32.0–36.0)
MCV: 100 fL (ref 80.0–100.0)
PLATELETS: 49 10*3/uL — AB (ref 150–440)
RBC: 3.5 MIL/uL — AB (ref 3.80–5.20)
RDW: 14.6 % — ABNORMAL HIGH (ref 11.5–14.5)
WBC: 4.9 10*3/uL (ref 3.6–11.0)

## 2015-05-01 LAB — AMMONIA: AMMONIA: 57 umol/L — AB (ref 9–35)

## 2015-05-01 MED ORDER — ALPRAZOLAM 0.25 MG PO TABS
0.2500 mg | ORAL_TABLET | Freq: Two times a day (BID) | ORAL | Status: DC | PRN
  Administered 2015-05-01 – 2015-05-02 (×2): 0.25 mg via ORAL
  Filled 2015-05-01 (×2): qty 1

## 2015-05-01 MED ORDER — ROPINIROLE HCL 0.25 MG PO TABS
0.5000 mg | ORAL_TABLET | Freq: Two times a day (BID) | ORAL | Status: DC
  Administered 2015-05-01 – 2015-05-02 (×3): 0.5 mg via ORAL
  Filled 2015-05-01 (×3): qty 2

## 2015-05-01 MED ORDER — SODIUM CHLORIDE 0.9 % IV BOLUS (SEPSIS)
1000.0000 mL | Freq: Once | INTRAVENOUS | Status: AC
  Administered 2015-05-01: 1000 mL via INTRAVENOUS

## 2015-05-01 MED ORDER — DIPHENHYDRAMINE HCL 25 MG PO CAPS
25.0000 mg | ORAL_CAPSULE | Freq: Every evening | ORAL | Status: DC | PRN
  Filled 2015-05-01: qty 1

## 2015-05-01 MED ORDER — ALBUMIN HUMAN 25 % IV SOLN
25.0000 g | Freq: Once | INTRAVENOUS | Status: AC
  Administered 2015-05-01: 25 g via INTRAVENOUS
  Filled 2015-05-01: qty 100

## 2015-05-01 MED ORDER — CETYLPYRIDINIUM CHLORIDE 0.05 % MT LIQD
7.0000 mL | Freq: Two times a day (BID) | OROMUCOSAL | Status: DC
  Administered 2015-05-01: 7 mL via OROMUCOSAL

## 2015-05-01 NOTE — Progress Notes (Addendum)
[facility] [place] NAME: Susan May    MR#:  [id number]  DATE OF BIRTH:  [birth year]  SUBJECTIVE:  Came in after patient was found to have some confusion weakness and poor by mouth intake for past couple days. Granddaughter at bedside. Patient has had declined paracentesis for last 2 weeks she is agreeable to do. Paracentesis today.   REVIEW OF SYSTEMS:   Review of Systems  Constitutional: Positive for malaise/fatigue. Negative for fever, chills and weight loss.  HENT: Negative for ear discharge, ear pain and nosebleeds.   Eyes: Negative for blurred vision, pain and discharge.  Respiratory: Negative for sputum production, shortness of breath, wheezing and stridor.   Cardiovascular: Negative for chest pain, palpitations, orthopnea and PND.  Gastrointestinal: Negative for nausea, vomiting, abdominal pain and diarrhea.  Genitourinary: Negative for urgency and frequency.  Musculoskeletal: Negative for back pain and joint pain.  Neurological: Positive for weakness. Negative for sensory change, speech change and focal weakness.  Psychiatric/Behavioral: Negative for depression and hallucinations. The patient is not nervous/anxious.    Tolerating Diet:yes, poor appetite recently Tolerating PT: pending  DRUG ALLERGIES:   Allergies  Allergen Reactions  . Neurontin [Gabapentin] Other (See Comments)    Increased confusion per the family    VITALS:  Blood pressure 133/55, pulse 85, temperature 98.2 F (36.8 C), temperature source Oral, resp. rate 19, height 5\' 3"  (1.6 m), weight 60.918 kg (134 lb 4.8 oz), SpO2 99 %.  PHYSICAL EXAMINATION:   Physical Exam  GENERAL:  81 y.o.-year-old patient lying in the bed with no acute distress. Thin,cachectic EYES: Pupils equal, round, reactive to light and accommodation. No scleral icterus. Extraocular muscles intact.  HEENT: Head atraumatic, normocephalic. Oropharynx and nasopharynx clear.   NECK:  Supple, no jugular venous distention. No thyroid enlargement, no tenderness.  LUNGS: Normal breath sounds bilaterally, no wheezing, rales, rhonchi. No use of accessory muscles of respiration.  CARDIOVASCULAR: S1, S2 normal. No murmurs, rubs, or gallops.  ABDOMEN: Soft, nontender, ++distended. Ascites present. No organomegaly or mass.  EXTREMITIES: No cyanosis, clubbing or edema b/l.    NEUROLOGIC: Cranial nerves II through XII are intact. No focal Motor or sensory deficits b/l.   PSYCHIATRIC: The patient is alert and oriented x 3.  SKIN: No obvious rash, lesion, or ulcer.    LABORATORY PANEL:   CBC  Recent Labs Lab 05/01/15 0329  WBC 4.9  HGB 11.9*  HCT 35.0  PLT 49*    Chemistries   Recent Labs Lab 04/30/15 1645 05/01/15 0329  NA 138 139  K 3.8 3.6  CL 103 105  CO2 26 24  GLUCOSE 166* 107*  BUN 45* 42*  CREATININE 2.21* 2.00*  CALCIUM 9.1 8.8*  AST 26  --   ALT 15  --   ALKPHOS 82  --   BILITOT 1.7*  --     Cardiac Enzymes  Recent Labs Lab 04/30/15 1645  TROPONINI <0.03    RADIOLOGY:  Dg Chest 1 View  04/30/2015  CLINICAL DATA:  Increasing lethargy today.  History of cirrhosis. EXAM: CHEST 1 VIEW COMPARISON:  03/18/2015 and 12/06/2014. FINDINGS: 1714 hours. There are persistent low lung volumes with stable mild bibasilar atelectasis. No edema, confluent airspace opacity, pleural effusion or pneumothorax. The heart size and mediastinal contours are stable. The bones appear unchanged. IMPRESSION: Radiographically stable appearance of the chest with mild bibasilar atelectasis related to low lung volumes. Electronically Signed   By: Caryl Comes.D.  On: 04/30/2015 17:37   ASSESSMENT AND PLAN:  Susan May is a 81 y.o. female who presents with decreased mentation and increasing weakness for the past 2 days. Patient's daughters present with her in the ED and provides most of the history. She states that her mother has had decreased by mouth intake  and increasing weakness and episodes of confusion over the past 2 days  1. Encephalopathy, hepatic (HCC) - restart patient's lactulose. Continue her other home meds, specifically rifaximin and spironolactone -Mentation much improved.  -Ammonia levels down from 86-57  2. Known history of Cirrhosis ([place]) -due to  NASH - continue home meds.  -We'll attempt to get patient back on her scheduled for paracentesis. And get one today. Spoke with IR nurse. -Patient gets weekly paracentesis therapeutic on every Wednesday. She has skipped for last 2 weeks. Reason unclear.  3. Acute on chronic renal failure (HCC) - gentle fluids and monitor closely. Avoid nephrotoxins. -Baseline creatinine around 1.9 - Came in with creatinine of 2.2 creatinine today is 2.0   4. Dehydration - due to decreased by mouth intake, will give gentle fluids tonight and reassess in the morning.  5.  Anxiety - continue home meds  6.  GERD (gastroesophageal reflux disease) - equivalent home dose PPI   7. Restless leg syndrome and bilateral lower extremity will give trial of Requip.    8. Generalized deconditioning weakness. Get physical therapy to see patient   9. Social worker/case management for discharge planning  10. Severe protein calorie Malnutrition will have dietitian see patient  11. Chronic obstructive he has suspected due to chronic cirrhosis of liver. Will hold off on any antiplatelet agents including DVT prophylaxis  Management plans discussed with the patient, family and they are in agreement.  CODE STATUS: DO NOT RESUSCITATE   DVT Prophylaxis: SCD and teds   TOTAL TIME TAKING CARE OF THIS PATIENT:  35  minutes.  >50% time spent on counselling and coordination of care patient's son and granddaughter  POSSIBLE D/C IN 1-2 DAYS, DEPENDING ON CLINICAL CONDITION.   Sundy Houchins M.D on 05/01/2015 at 11:46 AM  Between 7am to 6pm - Pager - [phone]  After 6pm go to [email or web address] - password [email or web address]  [facility]  Office  [phone]  CC: Primary care physician; Dion Body, MD

## 2015-05-01 NOTE — Progress Notes (Signed)
Notified Dr. Lavetta Nielsen about patient heart rate jumping up to 140's and then coming right back down. No new orders at this time.

## 2015-05-01 NOTE — Progress Notes (Signed)
Patient accepted back from paracentesis.  VSS.  Albumin hung per Dr. Serita Grit order.

## 2015-05-01 NOTE — Progress Notes (Signed)
Initial Nutrition Assessment  DOCUMENTATION CODES:   Severe malnutrition in context of chronic illness  INTERVENTION:   Meals and Snacks: Cater to patient preferences as MD just advanced diet order. Will send snacks such as cottage cheese and fruit between meals. Pt does not chew meats well per report. Coordination of Care: if pt at risk for aspiration, recommend SLP evaluation. Also Recommend daily weights.  Medical Food Supplement Therapy: will send Chocolate Mighty Shakes on meal trays once diet order advanced as currently no Pediasure in house and pt reports not liking Ensure/Boost. Pt does not like ice cream per report so will not send Magic Cup.    NUTRITION DIAGNOSIS:   Malnutrition related to chronic illness as evidenced by energy intake < or equal to 75% for > or equal to 1 month, severe depletion of muscle mass, severe depletion of body fat.  GOAL:   Patient will meet greater than or equal to 90% of their needs  MONITOR:    (Energy Intake, Anthropometrics, Digestive system, Electrolyte and Renal Profile)  REASON FOR ASSESSMENT:   Consult, Malnutrition Screening Tool Poor PO  ASSESSMENT:   Pt admitted with decreased mentation, increased weakness and hepatic encephalopathy. Pt with h/o cirrhosis with scheduled paracentesis weekly per MD note, however pt missed last 2 weeks PTA.  Past Medical History  Diagnosis Date  . COPD (chronic obstructive pulmonary disease) ([place])   . Shortness of breath dyspnea   . Cancer ([place])     Hx: skin cancer x 2:  one melanoma    . Cirrhosis of liver ([place])   . GERD (gastroesophageal reflux disease)   . Anxiety     Diet Order:  Diet 2 gram sodium Room service appropriate?: Yes; Fluid consistency:: Thin    Current Nutrition: Pt NPO  Food/Nutrition-Related History: Pt reports eating very poorly for the past couple of days and reports poor po intake for 'a very long time.' Pt reports having difficulty swallowing meats and therefore  only does vegetables for dinner. Pt reports only eating yogurt, cottage cheese, fruit/vegetables most days with a Pediasure. Pt reports eating very small amounts at meals secondary to poor appetite.    Scheduled Medications:  . albumin human  25 g Intravenous Once  . antiseptic oral rinse  7 mL Mouth Rinse BID  . fentaNYL  25 mcg Transdermal Q72H  . FLUoxetine  10 mg Oral Daily  . lactulose  30 g Oral TID  . metoCLOPramide  5 mg Oral QID  . pantoprazole  40 mg Oral Daily  . rifaximin  550 mg Oral BID  . rOPINIRole  0.5 mg Oral BID  . sodium chloride  3 mL Intravenous Q12H  . spironolactone  25 mg Oral BID     Electrolyte/Renal Profile and Glucose Profile:   Recent Labs Lab 04/30/15 1645 05/01/15 0329  NA 138 139  K 3.8 3.6  CL 103 105  CO2 26 24  BUN 45* 42*  CREATININE 2.21* 2.00*  CALCIUM 9.1 8.8*  GLUCOSE 166* 107*   Protein Profile:   Recent Labs Lab 04/30/15 1645  ALBUMIN 3.6   Hepatic Profile: Hepatic Function Latest Ref Rng 04/30/2015 03/29/2015 03/17/2015  Total Protein 6.5 - 8.1 g/dL 5.9(L) 5.8(L) 5.3(L)  Albumin 3.5 - 5.0 g/dL 3.6 3.9 3.7  AST 15 - 41 U/L _0 ALT 14 - 54 U/L _1 Alk Phosphatase 38 - 126 U/L 82 62 73  Total Bilirubin 0.3 - 1.2 mg/dL 1.7(H) 1.8(H) 1.7(H)  Gastrointestinal Profile: abdomen soft with fluid per MD note Last BM:  04/29/2015   Nutrition-Focused Physical Exam Findings: Nutrition-Focused physical exam completed. Findings are mild-severe fat depletion, moderate-severe muscle depletion, and pt with ascites on abdomen.    Weight Change: Pt reports last weight of 130-something lbs but unsure when last weighed. Pt reports having lost weight PTA. Per [facility] pt last weight of 125lbs on 03/17/2015, however also note pt 150lbs in May-June (11-17% weight loss in 6-7 months)   Height:   Ht Readings from Last 1 Encounters:  04/30/15 5' 3" (1.6 m)    Weight:   Wt Readings from Last 1 Encounters:  04/30/15 134 lb 4.8  oz (60.918 kg)   Wt Readings from Last 10 Encounters:  04/30/15 134 lb 4.8 oz (60.918 kg)  03/17/15 125 lb 9.6 oz (56.972 kg)  02/16/15 134 lb (60.782 kg)  12/06/14 148 lb (67.132 kg)  11/02/14 150 lb (68.04 kg)  10/05/14 153 lb (69.4 kg)  09/13/14 140 lb (63.504 kg)    BMI:  Body mass index is 23.8 kg/(m^2).  Estimated Nutritional Needs:   Kcal:  BEE: 1043kcals, TEE: (IF 1.1-1.3)(AF 1.3) 1492-1673kcals  Protein:  67-80g protein (1.1-1.3g/kg)  Fluid:  1523-1863m of fluid (25-375mkg)  EDUCATION NEEDS:   No education needs identified at this time   [place]RD, LDN Pager ([phone]

## 2015-05-01 NOTE — Progress Notes (Signed)
Spoke with Dr. Lavetta Nielsen and patient requesting something for sleep. Order received for Benadryl 25 mg orally at bedtime prn

## 2015-05-01 NOTE — Progress Notes (Signed)
VSS. No acute distress this shift.  Paracentesis completed with 4.25L of fluid removed.  Patient remains alert and oriented, but hard of hearing. Currently patient is saline locked.  Telemetry discontinued.  Patient continent and ambulates to bathroom with 1 assist.  No BM this shift. Patient with complaints of bilateral pain discomfort, Requip given with no change. Dr. Posey Pronto notified again and order obtained for xanax. Family at bedside.  Nurse will continue to monitor.

## 2015-05-01 NOTE — Progress Notes (Signed)
Spoke with Dr. Marcille Blanco about patient's heart rate being elevated and now sustaining in the 140s-150's be ICU. No new orders. Dr. Marcille Blanco will call and speak with tele clerk in ICU and go from there.

## 2015-05-01 NOTE — Progress Notes (Signed)
Family requesting xanax. Pt usually takes this at home.  Dr Posey Pronto ordered xanax

## 2015-05-01 NOTE — Progress Notes (Signed)
Patient slept off and on during the shift. Patient requested something to help her sleep but when RN returned to room patient was sound asleep. Heart rate still bouncing around from the 90s-150s. ICU notified RN each time. Dr. Lavetta Nielsen and Dr. Marcille Blanco aware. No new interventions at this time.

## 2015-05-01 NOTE — Progress Notes (Signed)
MD gave orders to discontinue telemetry

## 2015-05-01 NOTE — Progress Notes (Signed)
PT Cancellation Note  Patient Details Name: Susan May MRN: [id number] DOB: Apr 17, 1932   Cancelled Treatment:    Reason Eval/Treat Not Completed: Other (comment) (Nursing reports pt just returned from paracentesis and recommending holding PT at this time.)  Will re-attempt PT eval tomorrow.   Leitha Bleak 05/01/2015, 3:24 PM Leitha Bleak, [place]

## 2015-05-01 NOTE — Procedures (Signed)
Successful US guided paracentesis yielding 4.25 L of serous ascitic fluid. No immediate post procedural complications.   Ronny Bacon, MD Pager #: [phone]  '

## 2015-05-01 NOTE — Care Management Note (Signed)
Case Management Note  Patient Details  Name: MARGEURITE GASPARIAN MRN: [id number] Date of Birth: 26-May-1931  Subjective/Objective:  No longer open to [place]. Resides at home. Echo scheduled for today.                   Action/Plan:   Expected Discharge Date:                  Expected Discharge Plan:     In-House Referral:     Discharge planning Services     Post Acute Care Choice:    Choice offered to:     DME Arranged:    DME Agency:     HH Arranged:    [facility] Agency:     Status of Service:     Medicare Important Message Given:  Yes Date Medicare IM Given:    Medicare IM give by:    Date Additional Medicare IM Given:    Additional Medicare Important Message give by:     If discussed at [place] of Stay Meetings, dates discussed:    Additional Comments:  Rettie Laird A, RN 05/01/2015, 4:38 PM

## 2015-05-01 NOTE — Care Management Important Message (Signed)
Important Message  Patient Details  Name: Susan May MRN: [id number] Date of Birth: [birth year]   Medicare Important Message Given:  Yes    Damisha Wolff A, RN 05/01/2015, 7:53 AM

## 2015-05-02 LAB — BASIC METABOLIC PANEL
Anion gap: 10 (ref 5–15)
BUN: 36 mg/dL — AB (ref 6–20)
CALCIUM: 9.1 mg/dL (ref 8.9–10.3)
CHLORIDE: 105 mmol/L (ref 101–111)
CO2: 22 mmol/L (ref 22–32)
CREATININE: 1.72 mg/dL — AB (ref 0.44–1.00)
GFR, EST AFRICAN AMERICAN: 31 mL/min — AB (ref >60)
GFR, EST NON AFRICAN AMERICAN: 27 mL/min — AB (ref >60)
Glucose, Bld: 113 mg/dL — ABNORMAL HIGH (ref 65–99)
Potassium: 4.2 mmol/L (ref 3.5–5.1)
SODIUM: 137 mmol/L (ref 135–145)

## 2015-05-02 MED ORDER — ROPINIROLE HCL 0.5 MG PO TABS: 0.5000 mg | ORAL_TABLET | Freq: Two times a day (BID) | ORAL | Status: DC

## 2015-05-02 MED ORDER — SPIRONOLACTONE 25 MG PO TABS: 25.0000 mg | ORAL_TABLET | Freq: Every day | ORAL | Status: DC

## 2015-05-02 NOTE — Progress Notes (Signed)
Pt discharged via wheelchair to car with family  , AVS given to daughter verbalizes understanding ,

## 2015-05-02 NOTE — Plan of Care (Signed)
Problem: Safety: Goal: Ability to remain free from injury will improve Outcome: Progressing Pt free from falls this shift.  Problem: Health Behavior/Discharge Planning: Goal: Ability to manage health-related needs will improve Outcome: Progressing NO complaints of pain this shift.

## 2015-05-02 NOTE — Evaluation (Signed)
Physical Therapy Evaluation Patient Details Name: Susan May MRN: [id number] DOB: [birth year] Today's Date: 05/02/2015   History of Present Illness  Pt is an 81 y.o. female presenting to hospital with decreased mentation and increased weakness; pt has missed last 2 paracentesis scheduled every Wednesday.  Pt admitted to hospital with hepatitic encephalopathy and s/p paracentesis 05/01/15 (4.25 L obtained).  Clinical Impression  Prior to admission, pt was independent (ambulates holding onto furniture in home and uses rollator in community).  Pt lives with her daughter and has 24/7 assist from family as needed.  Currently pt is CGA with transfers and ambulation 160 feet with RW.  Pt occasionally mildly unsteady but able to self correct; ambulation distance and cadence limited d/t fatigue and pt refused stairs d/t fatigue.  Pt overall appearing deconditioned but also appearing strong in LE's with her transfers.  Pt would benefit from skilled PT to address noted impairments and functional limitations.  Recommend pt discharge to home with 24/7 assist, use of walker, and HHPT when medically appropriate.     Follow Up Recommendations Home health PT;Supervision/Assistance - 24 hour    Equipment Recommendations  Rolling walker with 5" wheels;3in1 (PT)    Recommendations for Other Services       Precautions / Restrictions Precautions Precautions: Fall Restrictions Weight Bearing Restrictions: No      Mobility  Bed Mobility Overal bed mobility: Modified Independent             General bed mobility comments: Supine to sit with HOB elevated  Transfers Overall transfer level: Needs assistance Equipment used: Rolling walker (2 wheeled) Transfers: Sit to/from Stand Sit to Stand: Min guard         General transfer comment: strong stand without loss of balance  Ambulation/Gait Ambulation/Gait assistance: Min guard Ambulation Distance (Feet): 160 Feet Assistive device: Rolling  walker (2 wheeled)   Gait velocity: decreased cadence   General Gait Details: decreased B step length/foot clearance/heelstrike; occasional mild unsteadiness but pt able to self correct  Stairs Stairs:  (pt declined d/t fatigue)          Wheelchair Mobility    Modified Rankin (Stroke Patients Only)       Balance Overall balance assessment: Needs assistance Sitting-balance support: No upper extremity supported;Feet supported Sitting balance-Leahy Scale: Good     Standing balance support: Bilateral upper extremity supported (on RW) Standing balance-Leahy Scale: Good                               Pertinent Vitals/Pain Pain Assessment: No/denies pain  Vitals stable and WFL throughout treatment session.    Home Living Family/patient expects to be discharged to:: Private residence Living Arrangements: Children (daughter) Available Help at Discharge: Family;Available 24 hours/day Type of Home: House Home Access: Stairs to enter Entrance Stairs-Rails: None Entrance Stairs-Number of Steps: 2 Home Layout: One level Home Equipment: Walker - 4 wheels      Prior Function Level of Independence: Independent with assistive device(s)         Comments: Pt "furniture cruises" in home and uses rollator in community.  Pt has someone at home with her 24/7.     Hand Dominance        Extremity/Trunk Assessment   Upper Extremity Assessment: Generalized weakness           Lower Extremity Assessment: Generalized weakness         Communication   Communication: [facility]  Cognition Arousal/Alertness: Awake/alert Behavior During Therapy: WFL for tasks assessed/performed Overall Cognitive Status:  (Oriented to person, place, situation but not time (pt reports November 20th 1964))                      General Comments   Nursing cleared pt for participation in physical therapy.  Pt agreeable to PT session. Pt's daughter present during session and  discussed PT recommendations and POC with pt and pt's daughter.    Exercises        Assessment/Plan    PT Assessment Patient needs continued PT services  PT Diagnosis Generalized weakness   PT Problem List Decreased strength;Decreased activity tolerance;Decreased balance;Decreased mobility;Decreased cognition  PT Treatment Interventions DME instruction;Gait training;Stair training;Functional mobility training;Therapeutic activities;Therapeutic exercise;Balance training;Patient/family education   PT Goals (Current goals can be found in the Care Plan section) Acute Rehab PT Goals Patient Stated Goal: to get stronger PT Goal Formulation: With patient/family Time For Goal Achievement: 05/16/15 Potential to Achieve Goals: Good    Frequency Min 2X/week   Barriers to discharge        Co-evaluation               End of Session Equipment Utilized During Treatment: Gait belt Activity Tolerance: Patient limited by fatigue Patient left: in chair;with call bell/phone within reach;with chair alarm set;with family/visitor present Nurse Communication: Mobility status         Time: [id number] PT Time Calculation (min) (ACUTE ONLY): 35 min   Charges:   PT Evaluation $Initial PT Evaluation Tier I: 1 Procedure PT Treatments $Therapeutic Exercise: 8-22 mins   PT G CodesLeitha Bleak 17-May-2015, 9:51 AM Leitha Bleak, [place]

## 2015-05-02 NOTE — Clinical Documentation Improvement (Signed)
Internal Medicine  Can the diagnosis of CKD be further specified? Please document findings in next progress note; not in BPA drop down box. Thank you!   CKD Stage I - GFR greater than or equal to 90  CKD Stage II - GFR 60-89  CKD Stage III - GFR 30-59  CKD Stage IV - GFR 15-29  CKD Stage V - GFR < 15  ESRD (End Stage Renal Disease)  Other condition  Unable to clinically determine  Supporting Information: : (risk factors, signs and symptoms, diagnostics, treatment)  White female  GFR's for current admission ranging from 20 to 27  Please exercise your independent, professional judgment when responding. A specific answer is not anticipated or expected.  Thank You, Zoila Shutter RN, BSN, [place] [phone]; Cell: [phone]

## 2015-05-02 NOTE — Care Management (Signed)
Patient to discharge today with home health PT.  Patient daughter at bedside.  Patient lives at home with daughter and has 24 hour support by family.  Pt daughter has selected [place].  Referral made to [employer].  MD to order RW, BSC, and shower chair.  Will from [facility] notified of DME needs. RNCM signing off

## 2015-05-02 NOTE — Discharge Summary (Signed)
[facility] [place] NAME: Susan May    MR#:  [id number]  DATE OF BIRTH:  19-Jan-1932  DATE OF ADMISSION:  04/30/2015 ADMITTING PHYSICIAN: Lance Coon, MD  DATE OF DISCHARGE: 05/02/15  PRIMARY CARE PHYSICIAN: Dion Body, MD    ADMISSION DIAGNOSIS:  Hepatic encephalopathy ([place]) [K72.90]  DISCHARGE DIAGNOSIS:  Hepatic Encephalopathy Chronic ascites with weekly paracentesis(wednesdays) Chronic cirrhosis of liver due to NASH Anxiety-chronic  SECONDARY DIAGNOSIS:   Past Medical History  Diagnosis Date  . COPD (chronic obstructive pulmonary disease) ([place])   . Shortness of breath dyspnea   . Cancer ([place])     Hx: skin cancer x 2:  one melanoma    . Cirrhosis of liver ([place])   . GERD (gastroesophageal reflux disease)   . Anxiety     HOSPITAL COURSE:  Susan May is a 81 y.o. female who presents with decreased mentation and increasing weakness for the past 2 days. Patient's daughters present with her in the ED and provides most of the history. She states that her mother has had decreased by mouth intake and increasing weakness and episodes of confusion over the past 2 days  1. Encephalopathy, hepatic (HCC) - restart patient's lactulose. Continue her other home meds, specifically rifaximin and spironolactone -Mentation much improved.  -Ammonia levels down from 86-57  2. Known history of Cirrhosis ([place]) -due to NASH - continue home meds.  -s/p paracentesis 12/19 removed 4.25 liters -Patient gets weekly paracentesis therapeutic on every Wednesday. She has skipped for last 2 weeks. Reason unclear.  3. Acute on chronic renal failure (HCC) - gentle fluids and monitor closely. Avoid nephrotoxins. -Baseline creatinine around 1.9 - Came in with creatinine of 2.2 creatinine today is 2.0 ---1.72 Will resume lasix and decreased spironolactone to 25 mg daily (renal dosing)  4. Dehydration -appears better  5.  Anxiety  - continue home meds  6.  GERD (gastroesophageal reflux disease) - equivalent home dose PPI  7. Restless leg syndrome and bilateral lower extremity will give trial of Requip.    D/w pt and family Will have PT see her and arrange HHPT DRUG ALLERGIES:   Allergies  Allergen Reactions  . Neurontin [Gabapentin] Other (See Comments)    Increased confusion per the family    DISCHARGE MEDICATIONS:   Current Discharge Medication List    START taking these medications   Details  rOPINIRole (REQUIP) 0.5 MG tablet Take 1 tablet (0.5 mg total) by mouth 2 (two) times daily. Qty: 60 tablet, Refills: 1      CONTINUE these medications which have CHANGED   Details  spironolactone (ALDACTONE) 25 MG tablet Take 1 tablet (25 mg total) by mouth daily. Qty: 30 tablet, Refills: 0      CONTINUE these medications which have NOT CHANGED   Details  albuterol (PROVENTIL HFA;VENTOLIN HFA) 108 (90 BASE) MCG/ACT inhaler Inhale 2 puffs into the lungs every 6 (six) hours as needed for wheezing or shortness of breath.    ALPRAZolam (XANAX) 0.25 MG tablet Take 0.25 mg by mouth 2 (two) times daily as needed. For anxiety and restless legs.    fentaNYL (DURAGESIC - DOSED MCG/HR) 25 MCG/HR patch Place 25 mcg onto the skin every 3 (three) days.    FLUoxetine (PROZAC) 10 MG capsule Take 10 mg by mouth daily.    fluticasone (FLONASE) 50 MCG/ACT nasal spray Place 2 sprays into both nostrils daily as needed for rhinitis.     furosemide (LASIX) 40 MG  tablet Take 40 mg by mouth daily.    lactulose (CHRONULAC) 10 GM/15ML solution Take 30 g by mouth 3 (three) times daily.     loratadine (CLARITIN) 10 MG tablet Take 10 mg by mouth daily.    metoCLOPramide (REGLAN) 5 MG tablet Take 5 mg by mouth 4 (four) times daily.    ondansetron (ZOFRAN-ODT) 8 MG disintegrating tablet Take 8 mg by mouth every 8 (eight) hours as needed for nausea or vomiting.    pantoprazole (PROTONIX) 40 MG tablet Take 40 mg by mouth daily.     rifaximin (XIFAXAN) 550 MG TABS tablet Take 550 mg by mouth 1 day or 1 dose.         If you experience worsening of your admission symptoms, develop shortness of breath, life threatening emergency, suicidal or homicidal thoughts you must seek medical attention immediately by calling 911 or calling your MD immediately  if symptoms less severe.  You Must read complete instructions/literature along with all the possible adverse reactions/side effects for all the Medicines you take and that have been prescribed to you. Take any new Medicines after you have completely understood and accept all the possible adverse reactions/side effects.   Please note  You were cared for by a hospitalist during your hospital stay. If you have any questions about your discharge medications or the care you received while you were in the hospital after you are discharged, you can call the unit and asked to speak with the hospitalist on call if the hospitalist that took care of you is not available. Once you are discharged, your primary care physician will handle any further medical issues. Please note that NO REFILLS for any discharge medications will be authorized once you are discharged, as it is imperative that you return to your primary care physician (or establish a relationship with a primary care physician if you do not have one) for your aftercare needs so that they can reassess your need for medications and monitor your lab values. Today   SUBJECTIVE   Weak. dter in the room  VITAL SIGNS:  Blood pressure 130/86, pulse 99, temperature 98.6 F (37 C), temperature source Oral, resp. rate 18, height 5\' 3"  (1.6 m), weight 60.328 kg (133 lb), SpO2 97 %.  I/O:   Intake/Output Summary (Last 24 hours) at 05/02/15 0725 Last data filed at 05/01/15 2011  Gross per 24 hour  Intake   1540 ml  Output    325 ml  Net   1215 ml    PHYSICAL EXAMINATION:  GENERAL:  81 y.o.-year-old patient lying in the bed with no  acute distress. Thin,cachectic EYES: Pupils equal, round, reactive to light and accommodation. No scleral icterus. Extraocular muscles intact.  HEENT: Head atraumatic, normocephalic. Oropharynx and nasopharynx clear.  NECK:  Supple, no jugular venous distention. No thyroid enlargement, no tenderness.  LUNGS: Normal breath sounds bilaterally, no wheezing, rales,rhonchi or crepitation. No use of accessory muscles of respiration.  CARDIOVASCULAR: S1, S2 normal. No murmurs, rubs, or gallops.  ABDOMEN: Soft, non-tender, non-distended. Bowel sounds present. No organomegaly or mass.  EXTREMITIES: No pedal edema, cyanosis, or clubbing.  NEUROLOGIC: Cranial nerves II through XII are intact. Muscle strength 4/5 in all extremities. Sensation intact. Gait not checked. Subjective weakness PSYCHIATRIC: The patient is alert and oriented x 3.  SKIN: No obvious rash, lesion, or ulcer.   DATA REVIEW:   CBC   Recent Labs Lab 05/01/15 0329  WBC 4.9  HGB 11.9*  HCT 35.0  PLT  [place] [facility] 04/30/15 1645  05/02/15 0442  NA 138  < > 137  K 3.8  < > 4.2  CL 103  < > 105  CO2 26  < > 22  GLUCOSE 166*  < > 113*  BUN 45*  < > 36*  CREATININE 2.21*  < > 1.72*  CALCIUM 9.1  < > 9.1  AST 26  --   --   ALT 15  --   --   ALKPHOS 82  --   --   BILITOT 1.7*  --   --   < > = values in this interval not displayed.   RADIOLOGY:  Dg Chest 1 View  04/30/2015  CLINICAL DATA:  Increasing lethargy today.  History of cirrhosis. EXAM: CHEST 1 VIEW COMPARISON:  03/18/2015 and 12/06/2014. FINDINGS: 1714 hours. There are persistent low lung volumes with stable mild bibasilar atelectasis. No edema, confluent airspace opacity, pleural effusion or pneumothorax. The heart size and mediastinal contours are stable. The bones appear unchanged. IMPRESSION: Radiographically stable appearance of the chest with mild bibasilar atelectasis related to low lung volumes. Electronically Signed   By: Richardean Sale M.D.   On: 04/30/2015 17:37   US Paracentesis  05/01/2015  INDICATION: Recurrent symptomatic ascites. EXAM: ULTRASOUND-GUIDED PARACENTESIS COMPARISON:  Multiple previous ultrasound-guided paracenteses. MEDICATIONS: None. COMPLICATIONS: None immediate TECHNIQUE: Informed written consent was obtained from the patient after a discussion of the risks, benefits and alternatives to treatment. A timeout was performed prior to the initiation of the procedure. Initial ultrasound scanning demonstrates a mA amount of ascites within the left lower abdominal quadrant. The right lower abdomen was prepped and draped in the usual sterile fashion. 1% lidocaine with epinephrine was used for local anesthesia. An ultrasound image was saved for documentation purposed. An 8 Fr Safe-T-Centesis catheter was introduced. The paracentesis was performed. The catheter was removed and a dressing was applied. The patient tolerated the procedure well without immediate post procedural complication. FINDINGS: A total of approximately 4.25 liters of serous fluid was removed. IMPRESSION: Successful ultrasound-guided paracentesis yielding 4.25 liters of peritoneal fluid. Electronically Signed   By: Sandi Mariscal M.D.   On: 05/01/2015 14:43     Management plans discussed with the patient, family and they are in agreement.  CODE STATUS:     Code Status Orders        Start     Ordered   04/30/15 2116  Do not attempt resuscitation (DNR)   Continuous    Question Answer Comment  In the event of cardiac or respiratory ARREST Do not call a "code blue"   In the event of cardiac or respiratory ARREST Do not perform Intubation, CPR, defibrillation or ACLS   In the event of cardiac or respiratory ARREST Use medication by any route, position, wound care, and other measures to relive pain and suffering. May use oxygen, suction and manual treatment of airway obstruction as needed for comfort.      04/30/15 2115    Advance Directive  Documentation        Most Recent Value   Type of Advance Directive  Out of facility DNR (pink MOST or yellow form), Healthcare Power of Attorney   Pre-existing out of facility DNR order (yellow form or pink MOST form)  Yellow form placed in chart (order not valid for inpatient use)   "MOST" Form in Place?        TOTAL TIME TAKING CARE OF  THIS PATIENT: 40  minutes.    Kamali Nephew M.D on 05/02/2015 at 7:25 AM  Between 7am to 6pm - Pager - [phone] After 6pm go to [email or web address] - password [email or web address] [facility]  Office  [phone]  CC: Primary care physician; Dion Body, MD

## 2015-05-03 ENCOUNTER — Ambulatory Visit: Payer: Medicare Other

## 2015-05-10 ENCOUNTER — Ambulatory Visit
Admission: RE | Admit: 2015-05-10 | Discharge: 2015-05-10 | Disposition: A | Discharge status: Home / Self Care | Payer: Medicare Other | Source: Ambulatory Visit | Attending: Gastroenterology | Admitting: Gastroenterology

## 2015-05-10 DIAGNOSIS — R188 Other ascites: Secondary | ICD-10-CM | POA: Diagnosis present

## 2015-05-10 MED ORDER — ALBUMIN HUMAN 25 % IV SOLN
25.0000 g | Freq: Once | INTRAVENOUS | Status: AC
  Administered 2015-05-10: 25 g via INTRAVENOUS
  Filled 2015-05-10: qty 100

## 2015-05-10 NOTE — Procedures (Signed)
Successful US guided paracentesis, yielding 3.5 liters of yellow fluid.  No immediate complication.

## 2015-05-17 ENCOUNTER — Ambulatory Visit
Admission: RE | Admit: 2015-05-17 | Discharge: 2015-05-17 | Disposition: A | Discharge status: Home / Self Care | Payer: Medicare Other | Source: Ambulatory Visit | Attending: Gastroenterology | Admitting: Gastroenterology

## 2015-05-17 DIAGNOSIS — R188 Other ascites: Secondary | ICD-10-CM | POA: Diagnosis present

## 2015-05-17 MED ORDER — ALBUMIN HUMAN 25 % IV SOLN
25.0000 g | Freq: Once | INTRAVENOUS | Status: AC
  Administered 2015-05-17: 25 g via INTRAVENOUS
  Filled 2015-05-17: qty 100

## 2015-05-24 ENCOUNTER — Ambulatory Visit
Admission: RE | Admit: 2015-05-24 | Discharge: 2015-05-24 | Disposition: A | Discharge status: Home / Self Care | Payer: Medicare Other | Source: Ambulatory Visit | Attending: Family Medicine | Admitting: Family Medicine

## 2015-05-24 MED ORDER — ALBUMIN HUMAN 25 % IV SOLN
25.0000 g | Freq: Once | INTRAVENOUS | Status: DC
  Filled 2015-05-24: qty 100

## 2015-05-25 ENCOUNTER — Encounter: Payer: Self-pay | Admitting: Pain Medicine

## 2015-05-25 ENCOUNTER — Ambulatory Visit: Payer: Medicare Other | Attending: Pain Medicine | Admitting: Pain Medicine

## 2015-05-25 ENCOUNTER — Other Ambulatory Visit: Payer: Self-pay | Admitting: Pain Medicine

## 2015-05-25 VITALS — BP 111/61 | HR 81 | Temp 97.7°F | Resp 16 | Ht 62.0 in | Wt 125.0 lb

## 2015-05-25 DIAGNOSIS — Z87891 Personal history of nicotine dependence: Secondary | ICD-10-CM | POA: Diagnosis not present

## 2015-05-25 DIAGNOSIS — F419 Anxiety disorder, unspecified: Secondary | ICD-10-CM | POA: Diagnosis not present

## 2015-05-25 DIAGNOSIS — G8929 Other chronic pain: Secondary | ICD-10-CM | POA: Insufficient documentation

## 2015-05-25 DIAGNOSIS — F1190 Opioid use, unspecified, uncomplicated: Secondary | ICD-10-CM | POA: Diagnosis not present

## 2015-05-25 DIAGNOSIS — J449 Chronic obstructive pulmonary disease, unspecified: Secondary | ICD-10-CM | POA: Diagnosis not present

## 2015-05-25 DIAGNOSIS — F329 Major depressive disorder, single episode, unspecified: Secondary | ICD-10-CM | POA: Diagnosis not present

## 2015-05-25 DIAGNOSIS — Z7189 Other specified counseling: Secondary | ICD-10-CM

## 2015-05-25 DIAGNOSIS — M4310 Spondylolisthesis, site unspecified: Secondary | ICD-10-CM

## 2015-05-25 DIAGNOSIS — K7460 Unspecified cirrhosis of liver: Secondary | ICD-10-CM | POA: Diagnosis not present

## 2015-05-25 DIAGNOSIS — Z79891 Long term (current) use of opiate analgesic: Secondary | ICD-10-CM | POA: Diagnosis not present

## 2015-05-25 DIAGNOSIS — K219 Gastro-esophageal reflux disease without esophagitis: Secondary | ICD-10-CM | POA: Diagnosis not present

## 2015-05-25 DIAGNOSIS — M539 Dorsopathy, unspecified: Secondary | ICD-10-CM

## 2015-05-25 DIAGNOSIS — R1013 Epigastric pain: Secondary | ICD-10-CM | POA: Diagnosis not present

## 2015-05-25 DIAGNOSIS — Q762 Congenital spondylolisthesis: Secondary | ICD-10-CM

## 2015-05-25 DIAGNOSIS — M961 Postlaminectomy syndrome, not elsewhere classified: Secondary | ICD-10-CM

## 2015-05-25 DIAGNOSIS — G894 Chronic pain syndrome: Secondary | ICD-10-CM

## 2015-05-25 DIAGNOSIS — M47816 Spondylosis without myelopathy or radiculopathy, lumbar region: Secondary | ICD-10-CM

## 2015-05-25 DIAGNOSIS — M4316 Spondylolisthesis, lumbar region: Secondary | ICD-10-CM | POA: Diagnosis not present

## 2015-05-25 DIAGNOSIS — M545 Low back pain, unspecified: Secondary | ICD-10-CM

## 2015-05-25 DIAGNOSIS — Z5181 Encounter for therapeutic drug level monitoring: Secondary | ICD-10-CM

## 2015-05-25 MED ORDER — FENTANYL 25 MCG/HR TD PT72: 25.0000 ug | MEDICATED_PATCH | TRANSDERMAL | Status: DC

## 2015-05-25 NOTE — Progress Notes (Signed)
Safety precautions to be maintained throughout the outpatient stay will include: orient to surroundings, keep bed in low position, maintain call bell within reach at all times, provide assistance with transfer out of bed and ambulation. Pt states she is out of patches

## 2015-05-26 ENCOUNTER — Encounter: Payer: Self-pay | Admitting: Pain Medicine

## 2015-05-26 DIAGNOSIS — M47816 Spondylosis without myelopathy or radiculopathy, lumbar region: Secondary | ICD-10-CM | POA: Insufficient documentation

## 2015-05-26 DIAGNOSIS — M4310 Spondylolisthesis, site unspecified: Secondary | ICD-10-CM | POA: Insufficient documentation

## 2015-05-26 DIAGNOSIS — M545 Low back pain: Secondary | ICD-10-CM

## 2015-05-26 DIAGNOSIS — F1190 Opioid use, unspecified, uncomplicated: Secondary | ICD-10-CM | POA: Insufficient documentation

## 2015-05-26 DIAGNOSIS — M961 Postlaminectomy syndrome, not elsewhere classified: Secondary | ICD-10-CM | POA: Insufficient documentation

## 2015-05-26 DIAGNOSIS — G8929 Other chronic pain: Secondary | ICD-10-CM | POA: Insufficient documentation

## 2015-05-26 DIAGNOSIS — G894 Chronic pain syndrome: Secondary | ICD-10-CM | POA: Insufficient documentation

## 2015-05-26 DIAGNOSIS — Z79891 Long term (current) use of opiate analgesic: Secondary | ICD-10-CM | POA: Insufficient documentation

## 2015-05-26 DIAGNOSIS — M549 Dorsalgia, unspecified: Secondary | ICD-10-CM

## 2015-05-26 DIAGNOSIS — R1013 Epigastric pain: Secondary | ICD-10-CM

## 2015-05-26 DIAGNOSIS — Z79899 Other long term (current) drug therapy: Secondary | ICD-10-CM | POA: Insufficient documentation

## 2015-05-26 NOTE — Progress Notes (Signed)
Patient's Name: Susan May MRN: [id number] DOB: 1932-01-05 DOS: 05/25/2015  Primary Reason(s) for Visit: Encounter for Medication Management CC: No chief complaint on file.   HPI:  Susan May is a 82 y.o. year old, female patient, who returns today as an established patient. She has Cirrhosis ([place]); NASH (nonalcoholic steatohepatitis); Acute on chronic renal failure (HCC); GERD (gastroesophageal reflux disease); Anxiety; Encephalopathy, hepatic ([place]); Chronic pain; Long term current use of opiate analgesic; Encounter for therapeutic drug level monitoring; Hepatic cirrhosis ([place]); Esophageal varices in cirrhosis ([place]); History of cardiovascular surgery; Chronic congestive splenomegaly; Portal hypertensive gastropathy; Esophageal stenosis (H/O SCHATZKI'S RING); Thoracic spine pain; Chronic pain syndrome; Chronic abdominal pain (epigastric); Chronic low back pain; Failed back surgical syndrome 2; 8 mm Anterolisthesis of L4 over L5; Lumbar facet arthropathy; Lumbar facet syndrome; Chronic upper back pain; Long term prescription opiate use; Opiate use; Polypharmacy; and Encounter for chronic pain management on her problem list.. Her primarily concern today is the No chief complaint on file.   The patient returns to the clinics for pharmacological management of her chronic pain. The patient was last seen at [facility].  Reported Pain Score: 5 , clinically the patient looks like a 2-3/10. Reported level is inconsistent with clinical obrservations. Pain Type: Chronic pain Pain Location: Abdomen Pain Orientation: Mid Pain Descriptors / Indicators: Aching, Sharp Pain Frequency: Constant  Date of Last Visit:  (last seen at [facility])    Pharmacotherapy  Review:   Onset of action: Within expected pharmacological parameters Time to Peak effect: Timing and results are as within normal expected parameters Effectiveness: Described as relatively effective, allowing for increase in activities of daily living  (ADL) % Relief: More than 50% Side-effects or Adverse reactions: None reported Duration of action: Within normal limits for medication [place] PMP: Compliant with practice rules and regulations UDS Results: No UDS available, at this time UDS Interpretation: No UDS available, at this time Medication Assessment Form: Reviewed. Patient indicates being compliant with therapy Treatment compliance: Compliant Substance Use Disorder (SUD) Risk Level: Low Pharmacologic Plan: Continue therapy as is  Lab Work: Illicit Drugs No results found for: THCU, COCAINSCRNUR, PCPSCRNUR, MDMA, AMPHETMU, METHADONE, ETOH  Inflammation Markers Lab Results  Component Value Date   ESRSEDRATE 8 08/27/2013    Renal Function Lab Results  Component Value Date   BUN 36* 05/02/2015   CREATININE 1.72* 05/02/2015   GFRAA 31* 05/02/2015   GFRNONAA 27* 05/02/2015    Hepatic Function Lab Results  Component Value Date   AST 26 04/30/2015   ALT 15 04/30/2015   ALBUMIN 3.6 04/30/2015    Electrolytes Lab Results  Component Value Date   NA 137 05/02/2015   K 4.2 05/02/2015   CL 105 05/02/2015   CALCIUM 9.1 05/02/2015   MG 1.8 08/27/2013    Allergies:  Susan May is allergic to neurontin.  Meds:  The patient has a current medication list which includes the following prescription(s): albuterol, fentanyl, fluoxetine, fluticasone, furosemide, lactulose, loratadine, metoclopramide, ondansetron, pantoprazole, rifaximin, ropinirole, fentanyl, and fentanyl, and the following Facility-Administered Medications: albumin human.  ROS:  Constitutional: Afebrile, no chills, well hydrated and well nourished Gastrointestinal: negative Musculoskeletal:negative Neurological: negative Behavioral/Psych: negative  PFSH:  Medical:  Susan May  has a past medical history of COPD (chronic obstructive pulmonary disease) ([place]); Shortness of breath dyspnea; Cancer ([place]); Cirrhosis of liver ([place]); GERD (gastroesophageal reflux  disease); Anxiety; Anxiety (04/30/2015); Clinical depression (05/13/1899); Encephalopathy, hepatic ([place]) (04/30/2015); Esophageal varices in cirrhosis ([place]) (09/30/2012); Esophagitis (09/10/1988); Bowel disease (09/30/2012); Bleeding  gums (01/13/2013); Bacterial overgrowth syndrome (09/30/2012); Abdominal dropsy (03/22/2013); Chronic congestive splenomegaly (01/13/2013); Dehydration (04/30/2015); Esophageal stenosis (H/O SCHATZKI'S RING) (05/13/1899); Fatty infiltration of liver (09/27/2013); Gastric prolapse (09/30/2012); H/O malignant neoplasm of skin (05/25/2012); NASH (nonalcoholic steatohepatitis); Periodontal disease (01/13/2013); Portal hypertensive gastropathy (09/30/2012); SCC (squamous cell carcinoma), arm (07/09/2012); and Thrombocytopenia ([place]) (01/13/2013). Family: family history includes Angina in her mother; Heart attack in her mother and sister; Hypertension in her mother; Lymphoma in her sister; Prostate cancer in her father. Surgical:  has past surgical history that includes Appendectomy; Vaginal hysterectomy; and Back surgery. Tobacco:  reports that she quit smoking about 25 years ago. Her smoking use included Cigarettes. She started smoking about 46 years ago. She smoked 0.50 packs per day. She has never used smokeless tobacco. Alcohol:  reports that she does not drink alcohol. Drug:  reports that she does not use illicit drugs.  Physical Exam:  Vitals:  Today's Vitals   05/25/15 1128 05/25/15 1130  BP: 111/61   Pulse: 81   Temp: 97.7 F (36.5 C)   Resp: 16   Height: 5\' 2"  (1.575 m)   Weight: 125 lb (56.7 kg)   SpO2: 99%   PainSc: 5  5   PainLoc: Abdomen     Calculated BMI: Body mass index is 22.86 kg/(m^2).   General appearance: alert, cooperative, appears stated age and no distress Eyes: PERLA Respiratory: No evidence respiratory distress, no audible rales or ronchi and no use of accessory muscles of respiration  Cervical Spine Inspection: Normal anatomy Alignment:  Symetrical Palpation: WNL ROM: Adequate  Upper Extremities Inspection: No gross anomalies detected ROM: Adequate Sensory: Normal Motor: 5/5 Pulses: Palpable  Thoracic Spine Inspection: No gross anomalies detected ROM: Adequate Palpation: Non-contributory  Lumbar Spine Inspection: No gross anomalies detected ROM: Decreased Palpation: Non-contributory Gait: Antalgic (limping)  Lower Extremities Inspection: No gross anomalies detected ROM: Adequate Sensory: Normal Motor: 5/5 Pulses: Palpable  Assessment & Plan:  The primary encounter diagnosis was Chronic pain. Diagnoses of Long term current use of opiate analgesic, Encounter for therapeutic drug level monitoring, Chronic pain syndrome, Chronic abdominal pain (epigastric), Chronic low back pain, Failed back surgical syndrome 2, 8 mm Anterolisthesis of L4 over L5, Lumbar facet syndrome, Opiate use, and Encounter for chronic pain management were also pertinent to this visit.  Assessment: Encounter for chronic pain management Pharmacological as well as interventional pain management.  8 mm Anterolisthesis of L4 over L5 This is likely to be triggering some of the low back pain secondary to the misalignment of the facet joints between the L4 and L5 vertebral bodies. In addition, this can also be causing some bilateral foraminal stenosis, as well as central canal stenosis at the affected level.  Failed back surgical syndrome 2 Epidural fibrosis from this particular problem could be causing an entrapment neuropathy of the nearby nerves. This could lead to lower extremity symptoms.  Lumbar facet syndrome This is likely to be significantly contributing to the patient's low back pain.   New since last visit: No new assessment & plan notes have been filed under this hospital service since the last note was generated. Service: Pain Management   Pharmacotherapy Orders: Meds ordered this encounter  Medications  . fentaNYL  (DURAGESIC - DOSED MCG/HR) 25 MCG/HR patch    Sig: Place 1 patch (25 mcg total) onto the skin every 3 (three) days.    Dispense:  10 patch    Refill:  0    Do not place this medication, or any other  prescription from our practice, on "Automatic Refill". Patient may have prescription filled one day early if pharmacy is closed on scheduled refill date. Do not fill until: 05/25/15 To last until: 06/24/15  . fentaNYL (DURAGESIC - DOSED MCG/HR) 25 MCG/HR patch    Sig: Place 1 patch (25 mcg total) onto the skin every 3 (three) days.    Dispense:  10 patch    Refill:  0    Do not place this medication, or any other prescription from our practice, on "Automatic Refill". Patient may have prescription filled one day early if pharmacy is closed on scheduled refill date. Do not fill until: 06/24/15 To last until: 07/21/15  . fentaNYL (DURAGESIC - DOSED MCG/HR) 25 MCG/HR patch    Sig: Place 1 patch (25 mcg total) onto the skin every 3 (three) days.    Dispense:  10 patch    Refill:  0    Do not place this medication, or any other prescription from our practice, on "Automatic Refill". Patient may have prescription filled one day early if pharmacy is closed on scheduled refill date. Do not fill until: 07/21/15 To last until: 08/20/15    Procedure Orders: Orders Placed This Encounter  Procedures  . Drugs of abuse screen w/o alc, rtn urine-sln    Radiology Orders: None  Interventional Therapies: None at this point.    Administered Medications: Ms. Yoshino had no medications administered during this visit.  Primary Care Physician: Dion Body, MD Location: [facility] Note by: Kathlen Brunswick Dossie Arbour, M.D, DABA, DABAPM, DABPM, DABIPP, FIPP

## 2015-05-26 NOTE — Assessment & Plan Note (Signed)
This is likely to be significantly contributing to the patient's low back pain.

## 2015-05-26 NOTE — Assessment & Plan Note (Signed)
Pharmacological as well as interventional pain management.

## 2015-05-26 NOTE — Assessment & Plan Note (Signed)
This is likely to be triggering some of the low back pain secondary to the misalignment of the facet joints between the L4 and L5 vertebral bodies. In addition, this can also be causing some bilateral foraminal stenosis, as well as central canal stenosis at the affected level.

## 2015-05-26 NOTE — Assessment & Plan Note (Signed)
Epidural fibrosis from this particular problem could be causing an entrapment neuropathy of the nearby nerves. This could lead to lower extremity symptoms.

## 2015-05-30 ENCOUNTER — Encounter: Payer: Self-pay | Admitting: Emergency Medicine

## 2015-05-30 ENCOUNTER — Inpatient Hospital Stay
Admission: EM | Admit: 2015-05-30 | Discharge: 2015-06-05 | DRG: 441 | Disposition: A | Discharge status: Hospice | Payer: Medicare Other | Attending: Internal Medicine | Admitting: Internal Medicine

## 2015-05-30 ENCOUNTER — Inpatient Hospital Stay: Payer: Medicare Other

## 2015-05-30 DIAGNOSIS — Z807 Family history of other malignant neoplasms of lymphoid, hematopoietic and related tissues: Secondary | ICD-10-CM | POA: Diagnosis not present

## 2015-05-30 DIAGNOSIS — R188 Other ascites: Secondary | ICD-10-CM | POA: Diagnosis present

## 2015-05-30 DIAGNOSIS — R0602 Shortness of breath: Secondary | ICD-10-CM

## 2015-05-30 DIAGNOSIS — K219 Gastro-esophageal reflux disease without esophagitis: Secondary | ICD-10-CM | POA: Diagnosis present

## 2015-05-30 DIAGNOSIS — K7460 Unspecified cirrhosis of liver: Secondary | ICD-10-CM | POA: Diagnosis present

## 2015-05-30 DIAGNOSIS — R627 Adult failure to thrive: Secondary | ICD-10-CM | POA: Diagnosis present

## 2015-05-30 DIAGNOSIS — K7581 Nonalcoholic steatohepatitis (NASH): Secondary | ICD-10-CM | POA: Diagnosis present

## 2015-05-30 DIAGNOSIS — G9340 Encephalopathy, unspecified: Secondary | ICD-10-CM | POA: Diagnosis present

## 2015-05-30 DIAGNOSIS — G894 Chronic pain syndrome: Secondary | ICD-10-CM | POA: Diagnosis present

## 2015-05-30 DIAGNOSIS — D696 Thrombocytopenia, unspecified: Secondary | ICD-10-CM | POA: Diagnosis present

## 2015-05-30 DIAGNOSIS — C439 Malignant melanoma of skin, unspecified: Secondary | ICD-10-CM | POA: Diagnosis present

## 2015-05-30 DIAGNOSIS — N179 Acute kidney failure, unspecified: Secondary | ICD-10-CM | POA: Diagnosis present

## 2015-05-30 DIAGNOSIS — K7290 Hepatic failure, unspecified without coma: Secondary | ICD-10-CM | POA: Diagnosis present

## 2015-05-30 DIAGNOSIS — J449 Chronic obstructive pulmonary disease, unspecified: Secondary | ICD-10-CM

## 2015-05-30 DIAGNOSIS — E43 Unspecified severe protein-calorie malnutrition: Secondary | ICD-10-CM | POA: Diagnosis present

## 2015-05-30 DIAGNOSIS — Z515 Encounter for palliative care: Secondary | ICD-10-CM | POA: Diagnosis present

## 2015-05-30 DIAGNOSIS — Z66 Do not resuscitate: Secondary | ICD-10-CM | POA: Diagnosis present

## 2015-05-30 DIAGNOSIS — Z85820 Personal history of malignant melanoma of skin: Secondary | ICD-10-CM

## 2015-05-30 DIAGNOSIS — N183 Chronic kidney disease, stage 3 (moderate): Secondary | ICD-10-CM | POA: Diagnosis present

## 2015-05-30 DIAGNOSIS — Z8249 Family history of ischemic heart disease and other diseases of the circulatory system: Secondary | ICD-10-CM | POA: Diagnosis not present

## 2015-05-30 DIAGNOSIS — Z87891 Personal history of nicotine dependence: Secondary | ICD-10-CM | POA: Diagnosis not present

## 2015-05-30 DIAGNOSIS — K766 Portal hypertension: Secondary | ICD-10-CM | POA: Diagnosis present

## 2015-05-30 DIAGNOSIS — G8929 Other chronic pain: Secondary | ICD-10-CM

## 2015-05-30 DIAGNOSIS — K7200 Acute and subacute hepatic failure without coma: Principal | ICD-10-CM | POA: Diagnosis present

## 2015-05-30 DIAGNOSIS — F419 Anxiety disorder, unspecified: Secondary | ICD-10-CM | POA: Diagnosis present

## 2015-05-30 DIAGNOSIS — K7682 Hepatic encephalopathy: Secondary | ICD-10-CM | POA: Diagnosis present

## 2015-05-30 DIAGNOSIS — R4182 Altered mental status, unspecified: Secondary | ICD-10-CM

## 2015-05-30 LAB — AMMONIA: AMMONIA: 92 umol/L — AB (ref 9–35)

## 2015-05-30 LAB — CBC
HCT: 40.2 % (ref 35.0–47.0)
Hemoglobin: 13.4 g/dL (ref 12.0–16.0)
MCH: 33.2 pg (ref 26.0–34.0)
MCHC: 33.2 g/dL (ref 32.0–36.0)
MCV: 99.9 fL (ref 80.0–100.0)
PLATELETS: 72 10*3/uL — AB (ref 150–440)
RBC: 4.03 MIL/uL (ref 3.80–5.20)
RDW: 15.8 % — ABNORMAL HIGH (ref 11.5–14.5)
WBC: 6.8 10*3/uL (ref 3.6–11.0)

## 2015-05-30 LAB — COMPREHENSIVE METABOLIC PANEL
ALK PHOS: 100 U/L (ref 38–126)
ALT: 15 U/L (ref 14–54)
AST: 33 U/L (ref 15–41)
Albumin: 3.2 g/dL — ABNORMAL LOW (ref 3.5–5.0)
Anion gap: 11 (ref 5–15)
BILIRUBIN TOTAL: 2.8 mg/dL — AB (ref 0.3–1.2)
BUN: 44 mg/dL — ABNORMAL HIGH (ref 6–20)
CALCIUM: 9.1 mg/dL (ref 8.9–10.3)
CO2: 23 mmol/L (ref 22–32)
CREATININE: 1.96 mg/dL — AB (ref 0.44–1.00)
Chloride: 101 mmol/L (ref 101–111)
GFR calc non Af Amer: 23 mL/min — ABNORMAL LOW (ref >60)
GFR, EST AFRICAN AMERICAN: 26 mL/min — AB (ref >60)
Glucose, Bld: 96 mg/dL (ref 65–99)
Potassium: 4.9 mmol/L (ref 3.5–5.1)
SODIUM: 135 mmol/L (ref 135–145)
Total Protein: 5.6 g/dL — ABNORMAL LOW (ref 6.5–8.1)

## 2015-05-30 LAB — URINALYSIS COMPLETE WITH MICROSCOPIC (ARMC ONLY)
Bacteria, UA: NONE SEEN
Bilirubin Urine: NEGATIVE
GLUCOSE, UA: NEGATIVE mg/dL
Hgb urine dipstick: NEGATIVE
Ketones, ur: NEGATIVE mg/dL
LEUKOCYTES UA: NEGATIVE
Nitrite: NEGATIVE
Protein, ur: NEGATIVE mg/dL
Specific Gravity, Urine: 1.012 (ref 1.005–1.030)
pH: 5 (ref 5.0–8.0)

## 2015-05-30 MED ORDER — ONDANSETRON HCL 4 MG/2ML IJ SOLN: 4.0000 mg | Freq: Four times a day (QID) | INTRAMUSCULAR | Status: DC | PRN

## 2015-05-30 MED ORDER — LACTULOSE 10 GM/15ML PO SOLN
30.0000 g | Freq: Four times a day (QID) | ORAL | Status: DC
Start: 2015-05-30 — End: 2015-06-05
  Administered 2015-05-30 – 2015-06-05 (×14): 30 g via ORAL
  Filled 2015-05-30 (×15): qty 60

## 2015-05-30 MED ORDER — SODIUM CHLORIDE 0.9 % IV SOLN
INTRAVENOUS | Status: DC
  Administered 2015-05-30: 17:00:00 via INTRAVENOUS

## 2015-05-30 MED ORDER — ALBUTEROL SULFATE (2.5 MG/3ML) 0.083% IN NEBU: 2.5000 mg | INHALATION_SOLUTION | Freq: Four times a day (QID) | RESPIRATORY_TRACT | Status: DC | PRN

## 2015-05-30 MED ORDER — RIFAXIMIN 550 MG PO TABS
550.0000 mg | ORAL_TABLET | Freq: Every day | ORAL | Status: DC
  Administered 2015-05-30 – 2015-06-05 (×5): 550 mg via ORAL
  Filled 2015-05-30 (×5): qty 1

## 2015-05-30 MED ORDER — ALPRAZOLAM 0.25 MG PO TABS: 0.2500 mg | ORAL_TABLET | Freq: Two times a day (BID) | ORAL | Status: DC | PRN

## 2015-05-30 MED ORDER — SODIUM CHLORIDE 0.9 % IJ SOLN
3.0000 mL | Freq: Two times a day (BID) | INTRAMUSCULAR | Status: DC
  Administered 2015-05-31 – 2015-06-04 (×8): 3 mL via INTRAVENOUS

## 2015-05-30 MED ORDER — DOCUSATE SODIUM 100 MG PO CAPS: 100.0000 mg | ORAL_CAPSULE | Freq: Every day | ORAL | Status: DC | PRN

## 2015-05-30 MED ORDER — FLUOXETINE HCL 10 MG PO CAPS
10.0000 mg | ORAL_CAPSULE | Freq: Every day | ORAL | Status: DC
  Administered 2015-05-31 – 2015-06-05 (×4): 10 mg via ORAL
  Filled 2015-05-30 (×6): qty 1

## 2015-05-30 MED ORDER — LACTULOSE ENEMA
300.0000 mL | Freq: Once | ORAL | Status: AC
  Administered 2015-05-30: 300 mL via RECTAL
  Filled 2015-05-30: qty 300

## 2015-05-30 MED ORDER — ROPINIROLE HCL 0.25 MG PO TABS
0.5000 mg | ORAL_TABLET | Freq: Two times a day (BID) | ORAL | Status: DC
  Administered 2015-05-30 – 2015-06-05 (×10): 0.5 mg via ORAL
  Filled 2015-05-30 (×10): qty 2

## 2015-05-30 MED ORDER — METOCLOPRAMIDE HCL 5 MG PO TABS
5.0000 mg | ORAL_TABLET | Freq: Four times a day (QID) | ORAL | Status: DC
  Administered 2015-05-30 – 2015-06-05 (×15): 5 mg via ORAL
  Filled 2015-05-30 (×15): qty 1

## 2015-05-30 MED ORDER — FLUTICASONE PROPIONATE 50 MCG/ACT NA SUSP
2.0000 | Freq: Every day | NASAL | Status: DC | PRN
  Filled 2015-05-30: qty 16

## 2015-05-30 MED ORDER — FENTANYL 25 MCG/HR TD PT72
25.0000 ug | MEDICATED_PATCH | TRANSDERMAL | Status: DC
  Administered 2015-05-30: 25 ug via TRANSDERMAL
  Filled 2015-05-30: qty 1

## 2015-05-30 MED ORDER — PANTOPRAZOLE SODIUM 40 MG PO TBEC
40.0000 mg | DELAYED_RELEASE_TABLET | Freq: Every day | ORAL | Status: DC
Start: 2015-05-30 — End: 2015-06-05
  Administered 2015-05-30 – 2015-06-05 (×5): 40 mg via ORAL
  Filled 2015-05-30 (×5): qty 1

## 2015-05-30 MED ORDER — SPIRONOLACTONE 25 MG PO TABS
25.0000 mg | ORAL_TABLET | Freq: Every day | ORAL | Status: DC
  Administered 2015-05-31 – 2015-06-05 (×4): 25 mg via ORAL
  Filled 2015-05-30 (×4): qty 1

## 2015-05-30 NOTE — ED Notes (Signed)
Per EMS: Has not ate or drank in 3 days, slept most the time, hx of cirrhosis, did not take medications at all today.  DNR.  has paracentesis every Wednesday.  A little altered at times.  hx of high ammonia levels at times. 160/78, 97% room air,  oral temp 98.8, cbg 109.  20 left forearm fluids running at [facility].  great great grand kids have diarrhea and vomiting.  patient does not have these symptoms.

## 2015-05-30 NOTE — Progress Notes (Signed)
No imaging has been ordered for this patient. This RN paged MD to ask if she would like any imaging ordered, given pt has regular paracentesis. MD gave order for CXR. Per MD, ultrasound at this time is not necessary.

## 2015-05-30 NOTE — H&P (Signed)
[facility] [place] NAME: Susan May    MR#:  [id number]  DATE OF BIRTH:  [birth year]  DATE OF ADMISSION:  05/30/2015  PRIMARY CARE PHYSICIAN: Dion Body, MD   REQUESTING/REFERRING PHYSICIAN: QUIGLY  CHIEF COMPLAINT:  Altered mental status with poor by mouth intake  HISTORY OF PRESENT ILLNESS:  Susan May  is a 82 y.o. female with a known history of liver cirrhosis, nash, GERD and COPD and multiple other medical problems is brought into the ED for progressively worsening mental status for the past one week and decreased by mouth intake for the past 2-3 days. Patient was unable to take her medications as she was confused. Here in the emergency department ammonia level is at 92. Grandchildren are sick with nausea and vomiting but patient has no nausea or vomiting. According to the daughter at bedside she was afebrile. Patient gets weekly paracentesis at 10:30 AM on Wednesdays, but distant patient's abdomen is not tight and not considering paracentesis. Patient is awake and alert but pleasantly confused  PAST MEDICAL HISTORY:   Past Medical History  Diagnosis Date  . COPD (chronic obstructive pulmonary disease) ([place])   . Shortness of breath dyspnea   . Cancer ([place])     Hx: skin cancer x 2:  one melanoma    . Cirrhosis of liver ([place])   . GERD (gastroesophageal reflux disease)   . Anxiety   . Anxiety 04/30/2015  . Clinical depression 05/13/1899  . Encephalopathy, hepatic ([place]) 04/30/2015  . Esophageal varices in cirrhosis ([place]) 09/30/2012  . Esophagitis 09/10/1988  . Bowel disease 09/30/2012  . Bleeding gums 01/13/2013  . Bacterial overgrowth syndrome 09/30/2012  . Abdominal dropsy 03/22/2013  . Chronic congestive splenomegaly 01/13/2013  . Dehydration 04/30/2015  . Esophageal stenosis (H/O SCHATZKI'S RING) 05/13/1899  . Fatty infiltration of liver 09/27/2013  . Gastric prolapse 09/30/2012  . H/O malignant neoplasm of skin 05/25/2012     History- Non-Melanoma Skin Cancer of left dorsal hand treated with Mohs surgery 06-29-12   . NASH (nonalcoholic steatohepatitis)   . Periodontal disease 01/13/2013  . Portal hypertensive gastropathy 09/30/2012  . SCC (squamous cell carcinoma), arm 07/09/2012    SCCIS of left dorsal hand treated with Mohs surgery 06-29-12   . Thrombocytopenia ([place]) 01/13/2013    PAST SURGICAL HISTOIRY:   Past Surgical History  Procedure Laterality Date  . Appendectomy    . Vaginal hysterectomy    . Back surgery      SOCIAL HISTORY:   Social History  Substance Use Topics  . Smoking status: Former Smoker -- 0.50 packs/day    Types: Cigarettes    Start date: 09/12/1968    Quit date: 09/12/1989  . Smokeless tobacco: Never Used  . Alcohol Use: No   Lives with daughter FAMILY HISTORY:   Family History  Problem Relation Age of Onset  . Hypertension Mother   . Angina Mother   . Heart attack Mother   . Prostate cancer Father   . Lymphoma Sister   . Heart attack Sister     DRUG ALLERGIES:   Allergies  Allergen Reactions  . Neurontin [Gabapentin] Other (See Comments)    Increased confusion per the family    REVIEW OF SYSTEMS:  Review of systems Limited in view of altered mental status   CONSTITUTIONAL: No fever, fatigue or weakness.  RESPIRATORY: No cough, shortness of breath, wheezing or hemoptysis.  CARDIOVASCULAR: No chest pain, orthopnea, edema.  GASTROINTESTINAL: No nausea,  vomiting, diarrhea or abdominal pain.  HEMATOLOGY: No anemia, easy bruising or bleeding NEUROLOGIC: Patient is pleasantly confused    MEDICATIONS AT HOME:   Prior to Admission medications   Medication Sig Start Date End Date Taking? Authorizing Provider  albuterol (PROVENTIL HFA;VENTOLIN HFA) 108 (90 BASE) MCG/ACT inhaler Inhale 2 puffs into the lungs every 6 (six) hours as needed for wheezing or shortness of breath.   Yes Historical Provider, MD  ALPRAZolam Duanne Moron) 0.25 MG tablet Take 1 tablet by mouth as  needed. 03/21/15  Yes Historical Provider, MD  fentaNYL (DURAGESIC - DOSED MCG/HR) 25 MCG/HR patch Place 1 patch (25 mcg total) onto the skin every 3 (three) days. 05/25/15  Yes Milinda Pointer, MD  FLUoxetine (PROZAC) 10 MG capsule Take 10 mg by mouth daily. 04/15/15  Yes Historical Provider, MD  fluticasone (FLONASE) 50 MCG/ACT nasal spray Place 2 sprays into both nostrils daily as needed for rhinitis.    Yes Historical Provider, MD  furosemide (LASIX) 40 MG tablet Take 40 mg by mouth daily.   Yes Historical Provider, MD  lactulose (CHRONULAC) 10 GM/15ML solution Take 30 g by mouth 3 (three) times daily.    Yes Historical Provider, MD  loratadine (CLARITIN) 10 MG tablet Take 10 mg by mouth daily.   Yes Historical Provider, MD  metoCLOPramide (REGLAN) 5 MG tablet Take 5 mg by mouth 4 (four) times daily.   Yes Historical Provider, MD  ondansetron (ZOFRAN-ODT) 8 MG disintegrating tablet Take 8 mg by mouth every 8 (eight) hours as needed for nausea or vomiting.   Yes Historical Provider, MD  pantoprazole (PROTONIX) 40 MG tablet Take 40 mg by mouth daily.   Yes Historical Provider, MD  rifaximin (XIFAXAN) 550 MG TABS tablet Take 550 mg by mouth daily.    Yes Historical Provider, MD  rOPINIRole (REQUIP) 0.5 MG tablet Take 1 tablet (0.5 mg total) by mouth 2 (two) times daily. 05/02/15  Yes Fritzi Mandes, MD  spironolactone (ALDACTONE) 25 MG tablet Take 1 tablet by mouth daily. 05/02/15  Yes Historical Provider, MD      VITAL SIGNS:  Blood pressure 129/59, pulse 76, temperature 98.5 F (36.9 C), temperature source Rectal, resp. rate 17, height 5\' 3"  (1.6 m), weight 55.792 kg (123 lb), SpO2 98 %.  PHYSICAL EXAMINATION:  GENERAL:  82 y.o.-year-old patient lying in the bed with no acute distress.  EYES: Pupils equal, round, reactive to light and accommodation. No scleral icterus. Extraocular muscles intact.  HEENT: Head atraumatic, normocephalic. Oropharynx and nasopharynx clear.  NECK:  Supple, no  jugular venous distention. No thyroid enlargement, no tenderness.  LUNGS: Normal breath sounds bilaterally, no wheezing, rales,rhonchi or crepitation. No use of accessory muscles of respiration.  CARDIOVASCULAR: S1, S2 normal. No murmurs, rubs, or gallops.  ABDOMEN: Soft, nontender, nondistended. Bowel sounds present. No organomegaly or mass.  EXTREMITIES: No pedal edema, cyanosis, or clubbing.  NEUROLOGIC: Pleasantly confused. Gait not checked.  PSYCHIATRIC: The patient is alert and oriented to person SKIN: No obvious rash, lesion, or ulcer.   LABORATORY PANEL:   CBC  Recent Labs Lab 05/30/15 1148  WBC 6.8  HGB 13.4  HCT 40.2  PLT 72*   ------------------------------------------------------------------------------------------------------------------  Chemistries   Recent Labs Lab 05/30/15 1148  NA 135  K 4.9  CL 101  CO2 23  GLUCOSE 96  BUN 44*  CREATININE 1.96*  CALCIUM 9.1  AST 33  ALT 15  ALKPHOS 100  BILITOT 2.8*   ------------------------------------------------------------------------------------------------------------------  Cardiac Enzymes No results for  input(s): TROPONINI in the last 168 hours. ------------------------------------------------------------------------------------------------------------------  RADIOLOGY:  No results found.  EKG:   Orders placed or performed during the hospital encounter of 12/06/14  . ED EKG  . ED EKG  . EKG    IMPRESSION AND PLAN:   1. Acute encephalopathy secondary to hepatic encephalopathy Patient with poor by mouth intake for the past 3 days but afebrile  Given lactulose enema once in the ED and  will continue lactulose every 6 hours Repeat ammonia level in a.m. and monitor closely  Continue her home medication xifaxan  We'll get neuro checks  2. History of liver cirrhosis secondary to [facility] with history of esophageal varices  patient gets weekly paracentesis on every Wednesday, she is scheduled to get  paracentesis at 10:30 AM tomorrow, but patient's abdomen is soft and no significant ascitic fluid is noticed and not considering paracentesis tomorrow Hold home medications Lasix and spironolactone and monitor blood pressure closely   3. Thrombocytopenia with no bleeding or bruises secondary to liver cirrhosis Monitor platelet count closely   4. Acute kidney injury from poor by mouth intake from altered mental status Hold Lasix and spironolactone and repeat BMP in a.m. and provide 500 cc gentle hydration  5. GERD-PPI    6. COPD-no exacerbation at this time. Provide breathing treatments as needed  code status -DNR  Daughter is considering palliative care consult  All the records are reviewed and case discussed with ED provider. Management plans discussed with the patient, family and they are in agreement.  CODE STATUS: DNR  TOTAL TIME TAKING CARE OF THIS PATIENT: 45 minutes.    Nicholes Mango M.D on 05/30/2015 at 2:07 PM  Between 7am to 6pm - Pager - [phone]  After 6pm go to [email or web address] - password [email or web address] [facility]  Office  [phone]  CC: Primary care physician; Dion Body, MD

## 2015-05-30 NOTE — ED Provider Notes (Signed)
Time Seen: Approximately 1205 I have reviewed the triage notes  Chief Complaint: Weakness and Altered Mental Status   History of Present Illness: Susan May is a 82 y.o. female  who was transported here by EMS. Patient's had a decreased food and fluid intake over the last 3 days. The patient has a known history of cirrhosis and has paracentesis performed on every Wednesday. Since had increased somnolence similar to when her ammonia level has been elevated. No fever or trauma at home. The patient denies any headaches, chest pain, abdominal pain. She is a somewhat limited historian due to her somnolence.   Past Medical History  Diagnosis Date  . COPD (chronic obstructive pulmonary disease) ([place])   . Shortness of breath dyspnea   . Cancer ([place])     Hx: skin cancer x 2:  one melanoma    . Cirrhosis of liver ([place])   . GERD (gastroesophageal reflux disease)   . Anxiety   . Anxiety 04/30/2015  . Clinical depression 05/13/1899  . Encephalopathy, hepatic ([place]) 04/30/2015  . Esophageal varices in cirrhosis ([place]) 09/30/2012  . Esophagitis 09/10/1988  . Bowel disease 09/30/2012  . Bleeding gums 01/13/2013  . Bacterial overgrowth syndrome 09/30/2012  . Abdominal dropsy 03/22/2013  . Chronic congestive splenomegaly 01/13/2013  . Dehydration 04/30/2015  . Esophageal stenosis (H/O SCHATZKI'S RING) 05/13/1899  . Fatty infiltration of liver 09/27/2013  . Gastric prolapse 09/30/2012  . H/O malignant neoplasm of skin 05/25/2012    History- Non-Melanoma Skin Cancer of left dorsal hand treated with Mohs surgery 06-29-12   . NASH (nonalcoholic steatohepatitis)   . Periodontal disease 01/13/2013  . Portal hypertensive gastropathy 09/30/2012  . SCC (squamous cell carcinoma), arm 07/09/2012    SCCIS of left dorsal hand treated with Mohs surgery 06-29-12   . Thrombocytopenia ([place]) 01/13/2013    Patient Active Problem List   Diagnosis Date Noted  . Hepatic encephalopathy ([place]) 05/30/2015  . Chronic pain syndrome  05/26/2015  . Chronic abdominal pain (epigastric) 05/26/2015  . Chronic low back pain 05/26/2015  . Failed back surgical syndrome 2 05/26/2015  . 8 mm Anterolisthesis of L4 over L5 05/26/2015  . Lumbar facet arthropathy 05/26/2015  . Lumbar facet syndrome 05/26/2015  . Chronic upper back pain 05/26/2015  . Long term prescription opiate use 05/26/2015  . Opiate use 05/26/2015  . Polypharmacy 05/26/2015  . Encounter for chronic pain management 05/26/2015  . Chronic pain 05/25/2015  . Long term current use of opiate analgesic 05/25/2015  . Encounter for therapeutic drug level monitoring 05/25/2015  . GERD (gastroesophageal reflux disease) 04/30/2015  . Anxiety 04/30/2015  . Encephalopathy, hepatic ([place]) 04/30/2015  . Acute on chronic renal failure ([place]) 03/17/2015  . Cirrhosis ([place])   . NASH (nonalcoholic steatohepatitis)   . Hepatic cirrhosis ([place]) 09/27/2013  . Chronic congestive splenomegaly 01/13/2013  . Esophageal varices in cirrhosis ([place]) 09/30/2012  . Portal hypertensive gastropathy 09/30/2012  . Thoracic spine pain 11/16/2010  . History of cardiovascular surgery 03/20/1999  . Esophageal stenosis (H/O SCHATZKI'S RING) 05/13/1899    Past Surgical History  Procedure Laterality Date  . Appendectomy    . Vaginal hysterectomy    . Back surgery      Past Surgical History  Procedure Laterality Date  . Appendectomy    . Vaginal hysterectomy    . Back surgery      No current outpatient prescriptions on file.  Allergies:  Neurontin  Family History: Family History  Problem Relation Age of Onset  .  Hypertension Mother   . Angina Mother   . Heart attack Mother   . Prostate cancer Father   . Lymphoma Sister   . Heart attack Sister     Social History: Social History  Substance Use Topics  . Smoking status: Former Smoker -- 0.50 packs/day    Types: Cigarettes    Start date: 09/12/1968    Quit date: 09/12/1989  . Smokeless tobacco: Never Used  . Alcohol Use:  No     Review of Systems:   10 point review of systems was performed and was otherwise negative:  Constitutional: No fever decreased food and fluid intake Eyes: No visual disturbances ENT: No sore throat, ear pain Cardiac: No chest pain Respiratory: No shortness of breath, wheezing, or stridor Abdomen: No abdominal pain, no vomiting, No diarrhea Endocrine: No weight loss, No night sweats Extremities: No peripheral edema, cyanosis Skin: No rashes, easy bruising Neurologic: No focal weakness, trouble with speech or swollowing Urologic: No dysuria, Hematuria, or urinary frequency   Physical Exam:  ED Triage Vitals  Enc Vitals Group     BP 05/30/15 1141 136/71 mmHg     Pulse Rate 05/30/15 1141 85     Resp 05/30/15 1141 17     Temp 05/30/15 1141 97.8 F (36.6 C)     Temp Source 05/30/15 1141 Oral     SpO2 05/30/15 1138 97 %     Weight 05/30/15 1141 123 lb (55.792 kg)     Height 05/30/15 1141 5\' 3"  (1.6 m)     Head Cir --      Peak Flow --      Pain Score 05/30/15 2025 0     Pain Loc --      Pain Edu? --      Excl. in [place]? --     General: Awake , Alert , and Oriented times 1 patient does follow commands. She is normally oriented 3 Head: Normal cephalic , atraumatic Eyes: Pupils equal , round, reactive to light Nose/Throat: No nasal drainage, patent upper airway without erythema or exudate.  Neck: Supple, Full range of motion, No anterior adenopathy or palpable thyroid masses Lungs: Clear to ascultation without wheezes , rhonchi, or rales Heart: Regular rate, regular rhythm without murmurs , gallops , or rubs Abdomen: Soft, non tender without rebound, guarding , or rigidity; bowel sounds positive and symmetric in all 4 quadrants. No organomegaly .        Extremities: 2 plus symmetric pulses. No edema, clubbing or cyanosis Neurologic: normal ambulation, Motor symmetric without deficits, sensory intact Skin: warm, dry, no rashes No extremity flap  Labs:   All  laboratory work was reviewed including any pertinent negatives or positives listed below:  Labs Reviewed  COMPREHENSIVE METABOLIC PANEL - Abnormal; Notable for the following:    BUN 44 (*)    Creatinine, Ser 1.96 (*)    Total Protein 5.6 (*)    Albumin 3.2 (*)    Total Bilirubin 2.8 (*)    GFR calc non Af Amer 23 (*)    GFR calc Af Amer 26 (*)    All other components within normal limits  CBC - Abnormal; Notable for the following:    RDW 15.8 (*)    Platelets 72 (*)    All other components within normal limits  AMMONIA - Abnormal; Notable for the following:    Ammonia 92 (*)    All other components within normal limits  URINALYSIS COMPLETEWITH MICROSCOPIC ([facility] ONLY) - Abnormal;  Notable for the following:    Color, Urine YELLOW (*)    APPearance CLEAR (*)    Squamous Epithelial / LPF 0-5 (*)    All other components within normal limits  COMPREHENSIVE METABOLIC PANEL  CBC  PROTIME-INR   review of the laboratory work shows an elevated ammonia   Radiology:  smoker  EXAM: CHEST 1 VIEW  COMPARISON: Portable exam 1636 hours compared to 04/30/2015  FINDINGS: Rotation to the RIGHT.  Borderline enlargement of cardiac silhouette.  Prominent superior mediastinum though accentuated by rotation.  Atherosclerotic calcification aorta.  Lungs clear.  No pleural effusion or pneumothorax.  Bones demineralized.  IMPRESSION: No definite acute abnormalities identified on rotated exam.    I personally reviewed the radiologic studies    ED Course:  Differential diagnosis is wide and considering altered mental status in an elderly female. I felt given her current clinical presentation and objective findings that her altered mental status could be explained by her elevated ammonia levels. She was started on rectal lactulose since she has had limited food and fluid intake.    Assessment:  Hepatic and cephalopathy   Final Clinical Impression  Final diagnoses:  Hepatic  encephalopathy [facility])     Plan: * Inpatient management            Daymon Larsen, MD 05/30/15 2330

## 2015-05-30 NOTE — Progress Notes (Signed)
Pt. admitted to unit, rm 243 from ED, report from [place], [place]. Oriented to room, call bell, Ascom phones and staff. Bed in low position. Fall safety plan reviewed, contract signed and placed on wall, yellow non-skid socks in place, bed alarm on. Full assessment to Epic; skin assessed with Caleen Jobs, RN. Telemetry box verified with tele clerk and Reather Converse, [place]: [phone] . Will continue to monitor.

## 2015-05-30 NOTE — Consult Note (Addendum)
Palliative Medicine Inpatient Consult Note   Name: Susan May Date: 05/30/2015 MRN: [id number]  DOB: [birth year]  Referring Physician: Nicholes Mango, MD  Palliative Care consult requested for this 82 y.o. female for goals of medical therapy in patient with hepatic encephalopathy.  Note was made that pt's daughter might be interested in discussing palliative options for care.    TODAY'S DISCUSSIONS AND DECISIONS: 1.  Pt is DNR.  DNR form is in the paper chart.   2.  Pt is normally cognitively normal per daughter.  But, her confusion is severe when she has effects of hepatic encephalopathy (confusion is less but still present today).    3. When pt cannot make her own decisions, daughter, Susan May states she is HCPOA (we will need to see if someone can pull this up from the records on file).  Pt has other adult children, but Susan May is who pt lives with and who cares for pt.  32.  Susan May is asking for Hospice services in the home when pt is discharged.  I feel she is hospice eligible b/c of her MELD score (which incorporates how her renal function affects her prognosis etc).     5.  Will ask care mgr to present choice of hospices and go from there. Susan May is aware I will be out until Monday (when I return).   53.  Susan May would like SLP eval before discharge once pt is alert enough. This is b/c she very recently started having trouble eating enough.  Susan May says she has to have foods chopped very finely or they stick to her esophagus b/c pt had a NISSAN yrs ago for reflux.  Pt does not have a good idea of how much or little to eat at any one time. She should have 6 small meals but often lately has shaking hands and isn't getting enough in.  She is developing malnutrition. She may  Have developed a dysphagia or simply have motility issues (possible given her h/o Nissan procedure in past).  I have ordered this as it will help to clarify and  expedite our orders for pt once she is discharged home with hospice care.     ________________________________________________   IMPRESSION: Hepatic Encephalopathy ---due to NASH related liver cirrhosis (total bilirubin is 2.8) ---MELD score is 22 which gives pt a 20% mortality likelihood within 77months time.  There is not a 6 month calculation, but given that pt is not a transplant candidate at her age, she should qualify for hospice services because she has a high liklihood of not being alive 6 months from now ---has known esphageal varices  ---with ascites requiring weekly paracenteses on Wednesdays ---Ammonia level was 92 at time of admission Loss of appetite and poor oral intake (albumin 3.2 here) Acute Renal Failure on CKD stage 2- 3 (Cr 1.96 here with baseline possibly around 1.7) --due to dehydration COPD Thrombocytopenia Anxiety Depression H/O esophagitis ALSO HAS HX OF NISSAN FUNDIPLICATION PROCEDURE SO SHE CANNOT EAT LARGE VOLUMES OF FOOD IN ONE SITTING. Chronic congestive splenomegaly H/O Esophageal stricture H/O skin cancers:  Melanoma, Squamous Cell Former smoker  GERD _______________________________________________________   REVIEW OF SYSTEMS:  Patient is not able to provide ROS due to encephalopathy  SPIRITUAL SUPPORT SYSTEM: Yes --family.  SOCIAL HISTORY:  reports that she quit smoking about 25 years ago. Her smoking use included Cigarettes. She started smoking about 46 years ago. She smoked 0.50 packs per day. She has never used smokeless tobacco. She reports that she does  not drink alcohol or use illicit drugs.  Lives at home with her daughter, Susan May.  Susan May is one of several adult children, but she states she has HCPOA over pt and that this form is or should be 'on file' here as she brought this in the last time pt was admitted here.  (Will have to look for this).  Susan May works at [employer] and she is familiar with hospice services because she interacts with the hospice staff coming to [place].  Susan May is hoping her mother will qualify for  Hospice in the home setting  (she does not want her mother in a facility).  Susan May works first shift from 7 am till 3 pm but while she is at work, there are always other family members present to help care for pt if/ when needed.  They would all welcome Hospice in the Home.     LEGAL DOCUMENTS:    CODE STATUS: DNR  PAST MEDICAL HISTORY: Past Medical History  Diagnosis Date  . COPD (chronic obstructive pulmonary disease) ([place])   . Shortness of breath dyspnea   . Cancer ([place])     Hx: skin cancer x 2:  one melanoma    . Cirrhosis of liver ([place])   . GERD (gastroesophageal reflux disease)   . Anxiety   . Anxiety 04/30/2015  . Clinical depression 05/13/1899  . Encephalopathy, hepatic ([place]) 04/30/2015  . Esophageal varices in cirrhosis ([place]) 09/30/2012  . Esophagitis 09/10/1988  . Bowel disease 09/30/2012  . Bleeding gums 01/13/2013  . Bacterial overgrowth syndrome 09/30/2012  . Abdominal dropsy 03/22/2013  . Chronic congestive splenomegaly 01/13/2013  . Dehydration 04/30/2015  . Esophageal stenosis (H/O SCHATZKI'S RING) 05/13/1899  . Fatty infiltration of liver 09/27/2013  . Gastric prolapse 09/30/2012  . H/O malignant neoplasm of skin 05/25/2012    History- Non-Melanoma Skin Cancer of left dorsal hand treated with Mohs surgery 06-29-12   . NASH (nonalcoholic steatohepatitis)   . Periodontal disease 01/13/2013  . Portal hypertensive gastropathy 09/30/2012  . SCC (squamous cell carcinoma), arm 07/09/2012    SCCIS of left dorsal hand treated with Mohs surgery 06-29-12   . Thrombocytopenia ([place]) 01/13/2013    PAST SURGICAL HISTORY:  Past Surgical History  Procedure Laterality Date  . Appendectomy    . Vaginal hysterectomy    . Back surgery      ALLERGIES:  is allergic to neurontin.  MEDICATIONS:  Current Facility-Administered Medications  Medication Dose Route Frequency Provider Last Rate Last Dose  . 0.9 %  sodium chloride infusion   Intravenous Continuous Nicholes Mango, MD 50 mL/hr at 05/30/15  1630    . albuterol (PROVENTIL) (2.5 MG/3ML) 0.083% nebulizer solution 2.5 mg  2.5 mg Inhalation Q6H PRN Nicholes Mango, MD      . ALPRAZolam Duanne Moron) tablet 0.25 mg  0.25 mg Oral BID PRN Nicholes Mango, MD      . docusate sodium (COLACE) capsule 100 mg  100 mg Oral Daily PRN Nicholes Mango, MD      . fentaNYL (DURAGESIC - dosed mcg/hr) patch 25 mcg  25 mcg Transdermal Q72H Nicholes Mango, MD   25 mcg at 05/30/15 1713  . FLUoxetine (PROZAC) capsule 10 mg  10 mg Oral Daily Aruna Gouru, MD      . fluticasone (FLONASE) 50 MCG/ACT nasal spray 2 spray  2 spray Each Nare Daily PRN Aruna Gouru, MD      . lactulose (CHRONULAC) 10 GM/15ML solution 30 g  30 g Oral Q6H Nicholes Mango, MD  30 g at 05/30/15 1633  . metoCLOPramide (REGLAN) tablet 5 mg  5 mg Oral QID Nicholes Mango, MD   5 mg at 05/30/15 1713  . ondansetron (ZOFRAN) injection 4 mg  4 mg Intravenous Q6H PRN Nicholes Mango, MD      . pantoprazole (PROTONIX) EC tablet 40 mg  40 mg Oral QAC breakfast Nicholes Mango, MD   40 mg at 05/30/15 1713  . rifaximin (XIFAXAN) tablet 550 mg  550 mg Oral Daily Nicholes Mango, MD   550 mg at 05/30/15 1713  . rOPINIRole (REQUIP) tablet 0.5 mg  0.5 mg Oral BID Aruna Gouru, MD      . sodium chloride 0.9 % injection 3 mL  3 mL Intravenous Q12H Nicholes Mango, MD      . Derrill Memo ON 05/31/2015] spironolactone (ALDACTONE) tablet 25 mg  25 mg Oral Daily Nicholes Mango, MD       Facility-Administered Medications Ordered in Other Encounters  Medication Dose Route Frequency Provider Last Rate Last Dose  . albumin human 25 % solution 25 g  25 g Intravenous Once Hulen Luster, MD        Vital Signs: BP 108/35 mmHg  Pulse 82  Temp(Src) 98 F (36.7 C) (Oral)  Resp 18  Ht 5\' 1"  (1.549 m)  Wt 54.477 kg (120 lb 1.6 oz)  BMI 22.70 kg/m2  SpO2 96% Filed Weights   05/30/15 1141 05/30/15 1622  Weight: 55.792 kg (123 lb) 54.477 kg (120 lb 1.6 oz)    Estimated body mass index is 22.7 kg/(m^2) as calculated from the following:   Height as of this encounter:  5\' 1"  (1.549 m).   Weight as of this encounter: 54.477 kg (120 lb 1.6 oz).  PERFORMANCE STATUS (ECOG) : 4 - Bedbound currently, but not at baseline  PHYSICAL EXAM: Lying on side in medical bed  --when she awoke as I spoke, she was confused but able to focus on her daughter and was holding daughter's hands EOMI Temporal wasting noted bilaterally Pt appears quite cachectic No JVD or TM Hrt rrr no mgr Lungs cta Abd mildly protuberant but soft and nontender Skin no mottling or cyanosis   LABS: CBC:    Component Value Date/Time   WBC 6.8 05/30/2015 1148   WBC 4.0 11/26/2013 1710   HGB 13.4 05/30/2015 1148   HGB 12.6 11/26/2013 1710   HCT 40.2 05/30/2015 1148   HCT 38.6 11/26/2013 1710   PLT 72* 05/30/2015 1148   PLT 84* 11/26/2013 1710   MCV 99.9 05/30/2015 1148   MCV 95 11/26/2013 1710   NEUTROABS 2.8 04/30/2015 1645   NEUTROABS 2.3 11/26/2013 1710   LYMPHSABS 0.9* 04/30/2015 1645   LYMPHSABS 1.0 11/26/2013 1710   MONOABS 0.8 04/30/2015 1645   MONOABS 0.6 11/26/2013 1710   EOSABS 0.2 04/30/2015 1645   EOSABS 0.1 11/26/2013 1710   BASOSABS 0.0 04/30/2015 1645   BASOSABS 0.0 11/26/2013 1710   Comprehensive Metabolic Panel:    Component Value Date/Time   NA 135 05/30/2015 1148   NA 128* 11/26/2013 1710   K 4.9 05/30/2015 1148   K 4.1 11/26/2013 1710   CL 101 05/30/2015 1148   CL 94* 11/26/2013 1710   CO2 23 05/30/2015 1148   CO2 30 11/26/2013 1710   BUN 44* 05/30/2015 1148   BUN 13 11/26/2013 1710   CREATININE 1.96* 05/30/2015 1148   CREATININE 1.53* 04/22/2014 1335   GLUCOSE 96 05/30/2015 1148   GLUCOSE 115* 11/26/2013 1710   CALCIUM  9.1 05/30/2015 1148   CALCIUM 8.3* 11/26/2013 1710   AST 33 05/30/2015 1148   AST 34 11/26/2013 1710   ALT 15 05/30/2015 1148   ALT 20 11/26/2013 1710   ALKPHOS 100 05/30/2015 1148   ALKPHOS 103 11/26/2013 1710   BILITOT 2.8* 05/30/2015 1148   BILITOT 1.2* 11/26/2013 1710   PROT 5.6* 05/30/2015 1148   PROT 5.9* 11/26/2013  1710   ALBUMIN 3.2* 05/30/2015 1148   ALBUMIN 3.1* 11/26/2013 1710    REFERRALS TO BE ORDERED:  Hospice for Hospice in the Home   More than 50% of the visit was spent in counseling/coordination of care: Yes  Time Spent: 80 minutes

## 2015-05-31 ENCOUNTER — Ambulatory Visit: Admission: RE | Admit: 2015-05-31 | Payer: Medicare Other | Source: Ambulatory Visit

## 2015-05-31 ENCOUNTER — Encounter: Payer: Self-pay | Admitting: *Deleted

## 2015-05-31 DIAGNOSIS — K7290 Hepatic failure, unspecified without coma: Secondary | ICD-10-CM

## 2015-05-31 DIAGNOSIS — R188 Other ascites: Secondary | ICD-10-CM

## 2015-05-31 DIAGNOSIS — Z515 Encounter for palliative care: Secondary | ICD-10-CM

## 2015-05-31 DIAGNOSIS — D696 Thrombocytopenia, unspecified: Secondary | ICD-10-CM

## 2015-05-31 DIAGNOSIS — E43 Unspecified severe protein-calorie malnutrition: Secondary | ICD-10-CM | POA: Insufficient documentation

## 2015-05-31 DIAGNOSIS — R630 Anorexia: Secondary | ICD-10-CM

## 2015-05-31 DIAGNOSIS — N179 Acute kidney failure, unspecified: Secondary | ICD-10-CM

## 2015-05-31 DIAGNOSIS — K7460 Unspecified cirrhosis of liver: Secondary | ICD-10-CM

## 2015-05-31 DIAGNOSIS — K7581 Nonalcoholic steatohepatitis (NASH): Secondary | ICD-10-CM

## 2015-05-31 DIAGNOSIS — J449 Chronic obstructive pulmonary disease, unspecified: Secondary | ICD-10-CM

## 2015-05-31 DIAGNOSIS — N183 Chronic kidney disease, stage 3 (moderate): Secondary | ICD-10-CM

## 2015-05-31 LAB — COMPREHENSIVE METABOLIC PANEL
ALBUMIN: 3.1 g/dL — AB (ref 3.5–5.0)
ALT: 15 U/L (ref 14–54)
AST: 22 U/L (ref 15–41)
Alkaline Phosphatase: 97 U/L (ref 38–126)
Anion gap: 11 (ref 5–15)
BUN: 45 mg/dL — AB (ref 6–20)
CHLORIDE: 105 mmol/L (ref 101–111)
CO2: 22 mmol/L (ref 22–32)
CREATININE: 1.97 mg/dL — AB (ref 0.44–1.00)
Calcium: 9.3 mg/dL (ref 8.9–10.3)
GFR calc Af Amer: 26 mL/min — ABNORMAL LOW (ref >60)
GFR calc non Af Amer: 23 mL/min — ABNORMAL LOW (ref >60)
GLUCOSE: 123 mg/dL — AB (ref 65–99)
POTASSIUM: 3.9 mmol/L (ref 3.5–5.1)
Sodium: 138 mmol/L (ref 135–145)
Total Bilirubin: 3.4 mg/dL — ABNORMAL HIGH (ref 0.3–1.2)
Total Protein: 5.5 g/dL — ABNORMAL LOW (ref 6.5–8.1)

## 2015-05-31 LAB — CBC
HEMATOCRIT: 38.9 % (ref 35.0–47.0)
Hemoglobin: 13.2 g/dL (ref 12.0–16.0)
MCH: 33.7 pg (ref 26.0–34.0)
MCHC: 33.9 g/dL (ref 32.0–36.0)
MCV: 99.4 fL (ref 80.0–100.0)
PLATELETS: 72 10*3/uL — AB (ref 150–440)
RBC: 3.91 MIL/uL (ref 3.80–5.20)
RDW: 15.4 % — AB (ref 11.5–14.5)
WBC: 6.2 10*3/uL (ref 3.6–11.0)

## 2015-05-31 LAB — PROTIME-INR
INR: 1.46
Prothrombin Time: 17.8 seconds — ABNORMAL HIGH (ref 11.4–15.0)

## 2015-05-31 LAB — AMMONIA: AMMONIA: 145 umol/L — AB (ref 9–35)

## 2015-05-31 MED ORDER — TRAMADOL HCL 50 MG PO TABS: 50.0000 mg | ORAL_TABLET | Freq: Two times a day (BID) | ORAL | Status: DC | PRN

## 2015-05-31 MED ORDER — LACTULOSE ENEMA
300.0000 mL | Freq: Once | ORAL | Status: AC
  Administered 2015-05-31: 300 mL via RECTAL
  Filled 2015-05-31: qty 300

## 2015-05-31 MED ORDER — TRAMADOL HCL 50 MG PO TABS
50.0000 mg | ORAL_TABLET | Freq: Four times a day (QID) | ORAL | Status: DC | PRN
  Filled 2015-05-31: qty 1

## 2015-05-31 NOTE — Progress Notes (Signed)
[facility] [place] NAME: Copper Witkin    MR#:  [id number]  DATE OF BIRTH:  1931-10-17  SUBJECTIVE:  CHIEF COMPLAINT:   Chief Complaint  Patient presents with  . Weakness  . Altered Mental Status   Patient is still confused. Is getting lactulose 2 times a day. No pain.  REVIEW OF SYSTEMS:    Review of Systems  Unable to perform ROS: mental status change      DRUG ALLERGIES:   Allergies  Allergen Reactions  . Neurontin [Gabapentin] Other (See Comments)    Increased confusion per the family    VITALS:  Blood pressure 116/70, pulse 94, temperature 98 F (36.7 C), temperature source Oral, resp. rate 18, height 5\' 1"  (1.549 m), weight 54.477 kg (120 lb 1.6 oz), SpO2 97 %.  PHYSICAL EXAMINATION:   Physical Exam  GENERAL:  82 y.o.-year-old patient lying in the bed with no acute distress.  EYES: Pupils equal, round, reactive to light and accommodation. No scleral icterus. Extraocular muscles intact.  HEENT: Head atraumatic, normocephalic. Oropharynx and nasopharynx clear.  NECK:  Supple, no jugular venous distention. No thyroid enlargement, no tenderness.  LUNGS: Normal breath sounds bilaterally, no wheezing, rales, rhonchi. No use of accessory muscles of respiration.  CARDIOVASCULAR: S1, S2 normal. No murmurs, rubs, or gallops.  ABDOMEN: Soft, nontender, nondistended. Bowel sounds present. No organomegaly or mass.  EXTREMITIES: No cyanosis, clubbing or edema b/l.    NEUROLOGIC: Cranial nerves II through XII are intact. No focal Motor or sensory deficits b/l.   PSYCHIATRIC: The patient is alert and awake. Confused SKIN: No obvious rash, lesion, or ulcer. Bruising   LABORATORY PANEL:   CBC  Recent Labs Lab 05/31/15 0505  WBC 6.2  HGB 13.2  HCT 38.9  PLT 72*   ------------------------------------------------------------------------------------------------------------------  Chemistries   Recent Labs Lab  05/31/15 0505  NA 138  K 3.9  CL 105  CO2 22  GLUCOSE 123*  BUN 45*  CREATININE 1.97*  CALCIUM 9.3  AST 22  ALT 15  ALKPHOS 97  BILITOT 3.4*   ------------------------------------------------------------------------------------------------------------------  Cardiac Enzymes No results for input(s): TROPONINI in the last 168 hours. ------------------------------------------------------------------------------------------------------------------  RADIOLOGY:  Dg Chest 1 View  05/30/2015  CLINICAL DATA:  Cirrhosis, NASH, GERD, [place] OPD, progressively worsening mental status over past week, and decreased oral intake, history of hepatic encephalopathy and esophageal varices, former smoker EXAM: CHEST 1 VIEW COMPARISON:  Portable exam 1636 hours compared to 04/30/2015 FINDINGS: Rotation to the RIGHT. Borderline enlargement of cardiac silhouette. Prominent superior mediastinum though accentuated by rotation. Atherosclerotic calcification aorta. Lungs clear. No pleural effusion or pneumothorax. Bones demineralized. IMPRESSION: No definite acute abnormalities identified on rotated exam. Electronically Signed   By: Lavonia Dana M.D.   On: 05/30/2015 16:55     ASSESSMENT AND PLAN:   1. Acute hepatic encephalopathy - due to not taking lactulose. Ammonia continues to be high at 142. Continue lactulose orally and repeat ammonia levels tomorrow. Fall precautions  2. History of liver cirrhosis secondary to [facility] with history of esophageal varices patient gets weekly paracentesis on every Wednesday. Abdomen is nontender and doesn't seem distended. No need for paracentesis at this time.  3. Thrombocytopenia with no bleeding or bruises secondary to liver cirrhosis Monitor platelet count closely  4. CKD stage III is stable   5. GERD-PPI   6. COPD-no exacerbation at this time. Provide breathing treatments as needed    All the records are reviewed  and case discussed with Care  Management/Social Workerr. Management plans discussed with the patient, family and they are in agreement.  CODE STATUS: DNR  DVT Prophylaxis: SCDs  TOTAL TIME TAKING CARE OF THIS PATIENT: 40 minutes.   POSSIBLE D/C IN 2-3 DAYS, DEPENDING ON CLINICAL CONDITION.   Hillary Bow R M.D on 05/31/2015 at 1:26 PM  Between 7am to 6pm - Pager - [phone]  After 6pm go to [email or web address] - password [email or web address] [facility]  Office  [phone]  CC: Primary care physician; Dion Body, MD    Note: This dictation was prepared with Dragon dictation along with smaller phrase technology. Any transcriptional errors that result from this process are unintentional.

## 2015-05-31 NOTE — Progress Notes (Signed)
Initial Nutrition Assessment  DOCUMENTATION CODES:   Severe malnutrition in context of chronic illness  INTERVENTION:  Meals and snacks: Cater to pt preferences, await diet progression Medical Nutrition Supplement: Recommend adding ensure enlive BID for added nutrition once diet progressed past clear liquids   NUTRITION DIAGNOSIS:   Inadequate oral intake related to acute illness as evidenced by per patient/family report.    GOAL:   Patient will meet greater than or equal to 90% of their needs    MONITOR:    (Energy intake, Gastrointestinal profile)  REASON FOR ASSESSMENT:   Diagnosis, Malnutrition Screening Tool    ASSESSMENT:     Pt admitted with hepatic encephalopathy Past Medical History  Diagnosis Date  . COPD (chronic obstructive pulmonary disease) ([place])   . Shortness of breath dyspnea   . Cancer ([place])     Hx: skin cancer x 2:  one melanoma    . Cirrhosis of liver ([place])   . GERD (gastroesophageal reflux disease)   . Anxiety   . Anxiety 04/30/2015  . Clinical depression 05/13/1899  . Encephalopathy, hepatic ([place]) 04/30/2015  . Esophageal varices in cirrhosis ([place]) 09/30/2012  . Esophagitis 09/10/1988  . Bowel disease 09/30/2012  . Bleeding gums 01/13/2013  . Bacterial overgrowth syndrome 09/30/2012  . Abdominal dropsy 03/22/2013  . Chronic congestive splenomegaly 01/13/2013  . Dehydration 04/30/2015  . Esophageal stenosis (H/O SCHATZKI'S RING) 05/13/1899  . Fatty infiltration of liver 09/27/2013  . Gastric prolapse 09/30/2012  . H/O malignant neoplasm of skin 05/25/2012    History- Non-Melanoma Skin Cancer of left dorsal hand treated with Mohs surgery 06-29-12   . NASH (nonalcoholic steatohepatitis)   . Periodontal disease 01/13/2013  . Portal hypertensive gastropathy 09/30/2012  . SCC (squamous cell carcinoma), arm 07/09/2012    SCCIS of left dorsal hand treated with Mohs surgery 06-29-12   . Thrombocytopenia ([place]) 01/13/2013    Current Nutrition: tolerating  liquids  Food/Nutrition-Related History: family at bedside reports good appetite up until a day or two before admission   Scheduled Medications:  . fentaNYL  25 mcg Transdermal Q72H  . FLUoxetine  10 mg Oral Daily  . lactulose  30 g Oral Q6H  . lactulose  300 mL Rectal Once  . metoCLOPramide  5 mg Oral QID  . pantoprazole  40 mg Oral QAC breakfast  . rifaximin  550 mg Oral Daily  . rOPINIRole  0.5 mg Oral BID  . sodium chloride  3 mL Intravenous Q12H  . spironolactone  25 mg Oral Daily      Electrolyte/Renal Profile and Glucose Profile:   Recent Labs Lab 05/30/15 1148 05/31/15 0505  NA 135 138  K 4.9 3.9  CL 101 105  CO2 23 22  BUN 44* 45*  CREATININE 1.96* 1.97*  CALCIUM 9.1 9.3  GLUCOSE 96 123*   Protein Profile:   Recent Labs Lab 05/30/15 1148 05/31/15 0505  ALBUMIN 3.2* 3.1*    Gastrointestinal Profile: Last BM:1/17   Nutrition-Focused Physical Exam Findings: Nutrition-Focused physical exam completed. Findings arr moderate fat depletion, moderate to severe muscle depletion, and no edema.      Weight Change: wt loss of 20% in the last 7 months    Diet Order:  Diet regular Room service appropriate?: Yes; Fluid consistency:: Thin  Skin:   reviewed   Height:   Ht Readings from Last 1 Encounters:  05/30/15 5\' 1"  (1.549 m)    Weight:   Wt Readings from Last 1 Encounters:  05/30/15 120 lb 1.6  oz (54.477 kg)    Ideal Body Weight:     BMI:  Body mass index is 22.7 kg/(m^2).  Estimated Nutritional Needs:   Kcal:  BEE 952 kcals (IF 1.0-1.3, AF 1.2) [id number] kcals/d  Protein:  (1.0-1.3 g/kg) 54-65 g/d  Fluid:  (25-17ml/kg) 1350-1685ml/d  EDUCATION NEEDS:   No education needs identified at this time  HIGH Care Level  Ben Sanz B. Zenia Resides, [place], [place] (pager) Weekend/On-Call pager [phone])

## 2015-06-01 LAB — COMPREHENSIVE METABOLIC PANEL
ALBUMIN: 3.2 g/dL — AB (ref 3.5–5.0)
ALK PHOS: 104 U/L (ref 38–126)
ALT: 14 U/L (ref 14–54)
AST: 22 U/L (ref 15–41)
Anion gap: 11 (ref 5–15)
BUN: 49 mg/dL — ABNORMAL HIGH (ref 6–20)
CALCIUM: 9.3 mg/dL (ref 8.9–10.3)
CO2: 21 mmol/L — AB (ref 22–32)
CREATININE: 2.23 mg/dL — AB (ref 0.44–1.00)
Chloride: 104 mmol/L (ref 101–111)
GFR calc non Af Amer: 19 mL/min — ABNORMAL LOW (ref >60)
GFR, EST AFRICAN AMERICAN: 23 mL/min — AB (ref >60)
GLUCOSE: 142 mg/dL — AB (ref 65–99)
Potassium: 4.2 mmol/L (ref 3.5–5.1)
SODIUM: 136 mmol/L (ref 135–145)
Total Bilirubin: 2.5 mg/dL — ABNORMAL HIGH (ref 0.3–1.2)
Total Protein: 5.8 g/dL — ABNORMAL LOW (ref 6.5–8.1)

## 2015-06-01 LAB — AMMONIA
Ammonia: 171 umol/L — ABNORMAL HIGH (ref 9–35)
Ammonia: 143 umol/L — ABNORMAL HIGH (ref 9–35)

## 2015-06-01 MED ORDER — ENSURE ENLIVE PO LIQD: 237.0000 mL | Freq: Two times a day (BID) | ORAL | Status: DC

## 2015-06-01 NOTE — Care Management Important Message (Signed)
Important Message  Patient Details  Name: Susan May MRN: [id number] Date of Birth: [birth year]   Medicare Important Message Given:  Yes    [name] 06/01/2015, 10:39 AM

## 2015-06-01 NOTE — Consult Note (Signed)
Pt with severe liver disease, failure to thrive.  On Xifaxan,lactulose.  See full consult note Dawson Bills, NP.  Prognosis poor.

## 2015-06-01 NOTE — Consult Note (Signed)
Consultation  Referring Provider: Dr. Darvin Neighbours Primary Care Physician:  Dion Body, MD Consulting  Gastroenterologist:  Dr. Gaylyn Cheers Reason for Consultation:  Hepatic encephalopathy           HPI:   Susan May is a 82 y.o. female with a known history of end- stage liver cirrhosis with frequent paracentesis, NASH, hepatic encephalopathy, esophageal varices, frequent UTI, chronic nausea,  GERD and COPD and multiple other medical problems is brought into the ED for progressively worsening mental status for the past one week and decreased by mouth intake for the past 2-3 days. Patient was unable to take her medications as she was confused. Her venous ammonia level was 92 in the ER  and has increased to 171-143. Her UA was  unremarkable.  Her daughter, Shirlean Mylar reports the patient has been losing a significant amount weight over the last 6 months and has had failure to thrive.  She has had poor appetite, and a very poor diet. She reports abdominal discomfort, no nausea or vomiting and a tendency toward constipation despite the recommended Lactulose medication. She has declined paracentesis as she is no longer experiencing ascites like she had in the past. Her last liver CT was in 2015. She is extremely weak. Her ammonia level has continued to rise. She presents on narcotics. There is a discussion currently  between Palliative Care and the family regarding hospice placement.  She lives with her daughter [name], who is not present for this exam. The patient is not speaking and unable to give any information.    Past Medical History  Diagnosis Date  . COPD (chronic obstructive pulmonary disease) ([place])   . Shortness of breath dyspnea   . Cancer ([place])     Hx: skin cancer x 2:  one melanoma    . Cirrhosis of liver ([place])   . GERD (gastroesophageal reflux disease)   . Anxiety 04/30/2015  . Clinical depression 05/13/1899  . Encephalopathy, hepatic ([place]) 04/30/2015  . Esophageal varices in  cirrhosis ([place]) 09/30/2012  . Esophagitis 09/10/1988  . Bowel disease 09/30/2012  . Bleeding gums 01/13/2013  . Bacterial overgrowth syndrome 09/30/2012  . Abdominal dropsy 03/22/2013  . Chronic congestive splenomegaly 01/13/2013  . Dehydration 04/30/2015  . Esophageal stenosis (H/O SCHATZKI'S RING) 05/13/1899  . Fatty infiltration of liver 09/27/2013  . Gastric prolapse 09/30/2012  . H/O malignant neoplasm of skin 05/25/2012    History- Non-Melanoma Skin Cancer of left dorsal hand treated with Mohs surgery 06-29-12   . NASH (nonalcoholic steatohepatitis)   . Periodontal disease 01/13/2013  . Portal hypertensive gastropathy 09/30/2012  . SCC (squamous cell carcinoma), arm 07/09/2012    SCCIS of left dorsal hand treated with Mohs surgery 06-29-12   . Thrombocytopenia ([place]) 01/13/2013    Past Surgical History  Procedure Laterality Date  . Appendectomy    . Vaginal hysterectomy    . Back surgery      Family History  Problem Relation Age of Onset  . Hypertension Mother   . Angina Mother   . Heart attack Mother   . Prostate cancer Father   . Lymphoma Sister   . Heart attack Sister      Social History  Substance Use Topics  . Smoking status: Former Smoker -- 0.50 packs/day    Types: Cigarettes    Start date: 09/12/1968    Quit date: 09/12/1989  . Smokeless tobacco: Never Used  . Alcohol Use: No    Prior to Admission medications   Medication Sig  Start Date End Date Taking? Authorizing Provider  albuterol (PROVENTIL HFA;VENTOLIN HFA) 108 (90 BASE) MCG/ACT inhaler Inhale 2 puffs into the lungs every 6 (six) hours as needed for wheezing or shortness of breath.   Yes Historical Provider, MD  ALPRAZolam Duanne Moron) 0.25 MG tablet Take 1 tablet by mouth as needed. 03/21/15  Yes Historical Provider, MD  fentaNYL (DURAGESIC - DOSED MCG/HR) 25 MCG/HR patch Place 1 patch (25 mcg total) onto the skin every 3 (three) days. 05/25/15  Yes Milinda Pointer, MD  FLUoxetine (PROZAC) 10 MG capsule Take 10 mg by  mouth daily. 04/15/15  Yes Historical Provider, MD  fluticasone (FLONASE) 50 MCG/ACT nasal spray Place 2 sprays into both nostrils daily as needed for rhinitis.    Yes Historical Provider, MD  furosemide (LASIX) 40 MG tablet Take 40 mg by mouth daily.   Yes Historical Provider, MD  lactulose (CHRONULAC) 10 GM/15ML solution Take 30 g by mouth 3 (three) times daily.    Yes Historical Provider, MD  loratadine (CLARITIN) 10 MG tablet Take 10 mg by mouth daily.   Yes Historical Provider, MD  metoCLOPramide (REGLAN) 5 MG tablet Take 5 mg by mouth 4 (four) times daily.   Yes Historical Provider, MD  ondansetron (ZOFRAN-ODT) 8 MG disintegrating tablet Take 8 mg by mouth every 8 (eight) hours as needed for nausea or vomiting.   Yes Historical Provider, MD  pantoprazole (PROTONIX) 40 MG tablet Take 40 mg by mouth daily.   Yes Historical Provider, MD  rifaximin (XIFAXAN) 550 MG TABS tablet Take 550 mg by mouth daily.    Yes Historical Provider, MD  rOPINIRole (REQUIP) 0.5 MG tablet Take 1 tablet (0.5 mg total) by mouth 2 (two) times daily. 05/02/15  Yes Fritzi Mandes, MD  spironolactone (ALDACTONE) 25 MG tablet Take 1 tablet by mouth daily. 05/02/15  Yes Historical Provider, MD    Current Facility-Administered Medications  Medication Dose Route Frequency Provider Last Rate Last Dose  . albuterol (PROVENTIL) (2.5 MG/3ML) 0.083% nebulizer solution 2.5 mg  2.5 mg Inhalation Q6H PRN Nicholes Mango, MD      . ALPRAZolam Duanne Moron) tablet 0.25 mg  0.25 mg Oral BID PRN Nicholes Mango, MD      . fentaNYL (DURAGESIC - dosed mcg/hr) patch 25 mcg  25 mcg Transdermal Q72H Nicholes Mango, MD   25 mcg at 05/30/15 1713  . FLUoxetine (PROZAC) capsule 10 mg  10 mg Oral Daily Nicholes Mango, MD   10 mg at 06/01/15 0911  . fluticasone (FLONASE) 50 MCG/ACT nasal spray 2 spray  2 spray Each Nare Daily PRN Nicholes Mango, MD      . lactulose (CHRONULAC) 10 GM/15ML solution 30 g  30 g Oral Q6H Aruna Gouru, MD   30 g at 06/01/15 0910  . metoCLOPramide  (REGLAN) tablet 5 mg  5 mg Oral QID Nicholes Mango, MD   5 mg at 06/01/15 0911  . ondansetron (ZOFRAN) injection 4 mg  4 mg Intravenous Q6H PRN Nicholes Mango, MD      . pantoprazole (PROTONIX) EC tablet 40 mg  40 mg Oral QAC breakfast Nicholes Mango, MD   40 mg at 06/01/15 0911  . rifaximin (XIFAXAN) tablet 550 mg  550 mg Oral Daily Nicholes Mango, MD   550 mg at 06/01/15 0911  . rOPINIRole (REQUIP) tablet 0.5 mg  0.5 mg Oral BID Nicholes Mango, MD   0.5 mg at 06/01/15 0910  . sodium chloride 0.9 % injection 3 mL  3 mL Intravenous Q12H Aruna  Gouru, MD   3 mL at 05/31/15 2121  . spironolactone (ALDACTONE) tablet 25 mg  25 mg Oral Daily Nicholes Mango, MD   25 mg at 06/01/15 0911  . traMADol (ULTRAM) tablet 50 mg  50 mg Oral Q12H PRN Hillary Bow, MD        Allergies as of 05/30/2015 - Review Complete 05/30/2015  Allergen Reaction Noted  . Neurontin [gabapentin] Other (See Comments) 05/01/2015     Review of Systems:    Unable to obtain from patient. See HPI.     Physical Exam:  Vital signs in last 24 hours: Temp:  [97.4 F (36.3 C)-97.6 F (36.4 C)] 97.6 F (36.4 C) (01/19 1134) Pulse Rate:  [98-102] 98 (01/19 1134) Resp:  [18] 18 (01/19 1134) BP: (119-123)/(75-96) 120/96 mmHg (01/19 1134) SpO2:  [95 %-98 %] 97 % (01/19 1134) Last BM Date: 06/01/15  General:  Frail, cachectic female sleeping.  Opens her eyes, nonverbal, turns herself over onto her right side Head:  Head without obvious abnormality, atraumatic  Eyes:   Conjunctiva pink, sclera anicteric   ENT:   Mouth free of lesions, mucosa moist, tongue pink, no thrush noted, teeth and gums normal Neck:   Supple w/o thyromegaly or mass, trachea midline, no adenopathy  Lungs: Clear to auscultation bilaterally, respirations unlabored Heart:     Normal S1S2, no rubs, murmurs, gallops. Abdomen: Soft, no apparent tenderness Rectal: Deferred Lymph:  No cervical or supraclavicular adenopathy. Extremities:   No edema Skin  Skin color sallow ,  texture, turgor normal, no rashes or lesions Neuro:  Sleeping, Movement x 4, not awake or responding to direct questions Psych:  Appropriate mood and affect.  Data Reviewed:  LAB RESULTS:  Recent Labs  05/30/15 1148 05/31/15 0505  WBC 6.8 6.2  HGB 13.4 13.2  HCT 40.2 38.9  PLT 72* 72*   BMET  Recent Labs  05/31/15 0505 06/01/15 0450  NA 138 136  K 3.9 4.2  CL 105 104  CO2 22 21*  GLUCOSE 123* 142*  BUN 45* 49*  CREATININE 1.97* 2.23*  CALCIUM 9.3 9.3   LFT  Recent Labs  06/01/15 0450  PROT 5.8*  ALBUMIN 3.2*  AST 22  ALT 14  ALKPHOS 104  BILITOT 2.5*   PT/INR  Recent Labs  05/31/15 0505  LABPROT 17.8*  INR 1.46    STUDIES: Dg Chest 1 View  05/30/2015  CLINICAL DATA:  Cirrhosis, NASH, GERD, [place] OPD, progressively worsening mental status over past week, and decreased oral intake, history of hepatic encephalopathy and esophageal varices, former smoker EXAM: CHEST 1 VIEW COMPARISON:  Portable exam 1636 hours compared to 04/30/2015 FINDINGS: Rotation to the RIGHT. Borderline enlargement of cardiac silhouette. Prominent superior mediastinum though accentuated by rotation. Atherosclerotic calcification aorta. Lungs clear. No pleural effusion or pneumothorax. Bones demineralized. IMPRESSION: No definite acute abnormalities identified on rotated exam. Electronically Signed   By: Lavonia Dana M.D.   On: 05/30/2015 16:55     Assessment:  DOVE CROSSNO is a 82 y.o.  female patient with end- stage liver cirrhosis, NASH, acute on chronic hepatic encephalopathy, esophageal varices  who presents with failure to thrive, profound weight loss of undetermined amount, reports by  family of abdominal discomfort, chronic mild nausea no vomiting, decreased appetite and inability to eat or drink over the last few days.  She presents with change in mental status from her baseline.  She is lethargic, was unable to give history.  Plan:  Consider pain management  evaluation  regarding her chronic narcotics.  Her liver is likely not processing the medication like it had in the past. She has CKDIII. According to nursing, she is able to take her lactulose and XIFAXAN. No evidence of UGI bleed or UTI.   Abdomen shows no evidence of ascites. This is likely a natural progression of end-stage liver disease.  This case will be discussed with Dr. Manya Silvas in collaboration of care. Thank you for the consultation.  Addendum: Dr. Vira Agar does not recommend any further testing at this time.   He does recommend supportive medical care.  Note: This dictation was prepared with Dragon dictation along with smaller phrase technology. Any transcriptional errors that result from this process are unintentional.  These services provided by Denice Paradise RN, MSN, ANP-BC under collaborative practice agreement with Manya Silvas, MD.  06/01/2015, 12:59 PM

## 2015-06-01 NOTE — Progress Notes (Signed)
[facility] [place] NAME: Susan May    MR#:  [id number]  DATE OF BIRTH:  February 04, 1932  SUBJECTIVE:  CHIEF COMPLAINT:   Chief Complaint  Patient presents with  . Weakness  . Altered Mental Status   Patient is still confused. Is getting lactulose as per schedule but ammonia level trending up .   REVIEW OF SYSTEMS:    Review of Systems  Unable to perform ROS: mental status change      DRUG ALLERGIES:   Allergies  Allergen Reactions  . Neurontin [Gabapentin] Other (See Comments)    Increased confusion per the family    VITALS:  Blood pressure 121/51, pulse 94, temperature 97.7 F (36.5 C), temperature source Oral, resp. rate 16, height 5\' 1"  (1.549 m), weight 54.477 kg (120 lb 1.6 oz), SpO2 97 %.  PHYSICAL EXAMINATION:   Physical Exam  GENERAL:  82 y.o.-year-old patient lying in the bed with no acute distress.  EYES: Pupils equal, round, reactive to light and accommodation.Positive scleral icterus.   HEENT: Head atraumatic, normocephalic. Oropharynx and nasopharynx clear.  NECK:  Supple, no jugular venous distention. No thyroid enlargement, no tenderness.  LUNGS: Normal breath sounds bilaterally, no wheezing, rales, rhonchi. No use of accessory muscles of respiration.  CARDIOVASCULAR: S1, S2 normal. No murmurs, rubs, or gallops.  ABDOMEN: Soft, nontender, nondistended. Bowel sounds present. No organomegaly or mass.  EXTREMITIES: No cyanosis, clubbing or edema b/l.    NEUROLOGIC: Cranial nerves II through XII are intact. No focal Motor or sensory deficits b/l.   PSYCHIATRIC: The patient is alert and awake. Confused SKIN: No obvious rash, lesion, or ulcer. Bruising   LABORATORY PANEL:   CBC  Recent Labs Lab 05/31/15 0505  WBC 6.2  HGB 13.2  HCT 38.9  PLT 72*   ------------------------------------------------------------------------------------------------------------------  Chemistries   Recent  Labs Lab 06/01/15 0450  NA 136  K 4.2  CL 104  CO2 21*  GLUCOSE 142*  BUN 49*  CREATININE 2.23*  CALCIUM 9.3  AST 22  ALT 14  ALKPHOS 104  BILITOT 2.5*   ------------------------------------------------------------------------------------------------------------------  Cardiac Enzymes No results for input(s): TROPONINI in the last 168 hours. ------------------------------------------------------------------------------------------------------------------  RADIOLOGY:  No results found.   ASSESSMENT AND PLAN:   1. Acute hepatic encephalopathy -. Ammonia continues to trend high at 142- 171 Continue lactulose orally and repeat ammonia levels c. Fall precautions Appreciate GI recommendations- ESLD , liver is not processing meds Pain management consult placed  Palliative care- changed code status to DNR and recommends hospice care at home , MELD scare is very high at 22   2. History of liver cirrhosis secondary to [facility] with history of esophageal varices patient gets weekly paracentesis on every Wednesday. Abdomen is nontender and doesn't seem distended. No need for paracentesis at this time. Continue lactulose and xifixan  3. Thrombocytopenia with no bleeding or bruises secondary to liver cirrhosis Monitor platelet count closely  4. CKD stage III is stable   5. GERD-PPI   6. COPD-no exacerbation at this time. Provide breathing treatments as needed    All the records are reviewed and case discussed with Care Management/Social Workerr. Management plans discussed with the patient, family and they are in agreement.  CODE STATUS: DNR  DVT Prophylaxis: SCDs  TOTAL TIME TAKING CARE OF THIS PATIENT: 5minutes.   POSSIBLE D/C IN 2-3 DAYS, DEPENDING ON CLINICAL CONDITION.   Nicholes Mango M.D on 06/01/2015 at 9:13 PM  Between 7am to  6pm - Pager - [phone]  After 6pm go to [email or web address] - password [email or web address] [facility]  Office   [phone]  CC: Primary care physician; Dion Body, MD    Note: This dictation was prepared with Dragon dictation along with smaller phrase technology. Any transcriptional errors that result from this process are unintentional.

## 2015-06-01 NOTE — Progress Notes (Addendum)
Speech Therapy Note: reviewed chart notes; consulted NSG re: pt's status today. NSG reported pt was briefly awake earlier this morning and was able to take crushed po meds w/ a couple of tsps of puree and she drank a small amount of a liquid med. NSG reported no coughing or other overt s/s of aspiration during this intake. Dtr in room stated pt has been able to take a few bites/sips intermittently in the past 2 days but "has not taken much"; she also indicated pt coughed w/ a bite of jello when eating bites of jello last night w/ another Dtr.  Upon Dtr(s) talking to pt, pt only alert/awakened briefly to verbal/tactile stim  - she opened eyes and mumbled in her speech but indicated "no" and shook her head "no" when asked if she wanted something to drink. When not given stim, she closed her eyes again and fell asleep. Pt mostly remained in a fetal position sleeping while talking w/ family. Discussed w/ family present that pt did not appear awake enough to safely take po's at this hour. Rec'd strict aspiration precautions, and discussed these w/ family, if pt awakens and po's are attempted by family or NSG. Discussed signs of readiness for po's including a fully, awake status to engage in the task of eating/drinking. Diet consistency modified to a Pureed diet for easier oral management and less mastication effort on pt's part. Family agreed. NSG updated.  ST services will f/u tomorrow w/ BSE and further education w/ pt/family and staff.

## 2015-06-01 NOTE — Progress Notes (Signed)
Palliative Care Follow-Up- Spoke with care team regarding this patient. Still has chest tube and receiving IV antibiotics at this time. When SNF for rehab was mentioned to patient, who is alert and oriented at this time, he refused. Care management has spoken with family who report that although they have medical knowledge, patient's care needs may exceed what they are able to provide in the home. Consensus is to let patient rest today and discuss rehab at a later time. Will continue to follow.  Atha Starks, MSW, LCSW Palliative Care Social Worker [phone]

## 2015-06-02 LAB — AMMONIA
Ammonia: 111 umol/L — ABNORMAL HIGH (ref 9–35)
Ammonia: 136 umol/L — ABNORMAL HIGH (ref 9–35)

## 2015-06-02 LAB — COMPREHENSIVE METABOLIC PANEL
ALBUMIN: 3.3 g/dL — AB (ref 3.5–5.0)
ALK PHOS: 100 U/L (ref 38–126)
ALT: 17 U/L (ref 14–54)
ANION GAP: 12 (ref 5–15)
AST: 24 U/L (ref 15–41)
BILIRUBIN TOTAL: 2.8 mg/dL — AB (ref 0.3–1.2)
BUN: 57 mg/dL — ABNORMAL HIGH (ref 6–20)
CALCIUM: 9.4 mg/dL (ref 8.9–10.3)
CO2: 22 mmol/L (ref 22–32)
Chloride: 103 mmol/L (ref 101–111)
Creatinine, Ser: 2.31 mg/dL — ABNORMAL HIGH (ref 0.44–1.00)
GFR, EST AFRICAN AMERICAN: 22 mL/min — AB (ref >60)
GFR, EST NON AFRICAN AMERICAN: 19 mL/min — AB (ref >60)
GLUCOSE: 135 mg/dL — AB (ref 65–99)
POTASSIUM: 3.8 mmol/L (ref 3.5–5.1)
Sodium: 137 mmol/L (ref 135–145)
TOTAL PROTEIN: 5.6 g/dL — AB (ref 6.5–8.1)

## 2015-06-02 LAB — TOXASSURE SELECT 13 (MW), URINE: PDF: 0

## 2015-06-02 NOTE — Progress Notes (Signed)
New referral for [facility] services at home following discharge received from [facility]. Patient is an 82 year old woman with a known history of liver cirrhosis secondary to NASH, along with multiple other comorbitities. She was admitted to [facility] on 1/17 for evaluation and treatment of increased confusion and poor appetite. Her admission ammonia level was 145, she has been receiving lactulose, ammonia level continued to rise until this morning when it was 111. Patient's daughter and HCPOA Fredderick Severance has met with Palliative Medicine physician and social worker and have chosen to take her home with hospice services. Writer spoke over the phone with [name] [phone] to initiate education regarding hospice services, philosophy and team approach to care with good understanding voiced. [name] reports that patient will be discharging to her (Mary's) home at: [address]. [place], [place] [address]. CMRN Lyn Rockett advised. [name] has requested a hospital bed with 1/2 rails to be delivered to her home tomorrow 1/21. This needs to be in place prior to discharge. Signed DNR in place on the chart, patient will most likely need EMS transport at discharge. Hospital care team updated. Patient seen lying in bed, with eyes closed, roused slightly to name, denied pain, adjusted herself in the bed and went back to sleep. Per family and chart note review patient is not eating. She has taken her scheduled oral medications today,she  has required no PRN pain medications. She is incontinent of bowel and bladder.  Patient information faxed to referral intake. Please contact Hospice at [phone] if patient discharges over the weekend. Hospice information and contact number left in patient's room for mary at her request. Thank you for the opportunity to be involved in the care of this patient. Flo Shanks RN, BSN, [place] [facility] [place], [facility] [phone] c

## 2015-06-02 NOTE — Progress Notes (Signed)
[facility] [place] NAME: Susan May    MR#:  [id number]  DATE OF BIRTH:  09/16/1931  SUBJECTIVE:  CHIEF COMPLAINT:   Chief Complaint  Patient presents with  . Weakness  . Altered Mental Status   Patient is still confused but slightly better. Is getting lactulose as per schedule , ammonia level is trending down today   REVIEW OF SYSTEMS:    Review of Systems  Unable to perform ROS: mental status change      DRUG ALLERGIES:   Allergies  Allergen Reactions  . Neurontin [Gabapentin] Other (See Comments)    Increased confusion per the family    VITALS:  Blood pressure 130/63, pulse 99, temperature 99.1 F (37.3 C), temperature source Oral, resp. rate 17, height 5\' 1"  (1.549 m), weight 54.477 kg (120 lb 1.6 oz), SpO2 98 %.  PHYSICAL EXAMINATION:   Physical Exam  GENERAL:  82 y.o.-year-old patient lying in the bed with no acute distress.  EYES: Pupils equal, round, reactive to light and accommodation.Positive scleral icterus.   HEENT: Head atraumatic, normocephalic. Oropharynx and nasopharynx clear.  NECK:  Supple, no jugular venous distention. No thyroid enlargement, no tenderness.  LUNGS: Normal breath sounds bilaterally, no wheezing, rales, rhonchi. No use of accessory muscles of respiration.  CARDIOVASCULAR: S1, S2 normal. No murmurs, rubs, or gallops.  ABDOMEN: Soft, nontender, nondistended. Bowel sounds present. No organomegaly or mass.  EXTREMITIES: No cyanosis, clubbing or edema b/l.    NEUROLOGIC: Cranial nerves II through XII are intact. No focal Motor or sensory deficits b/l.   PSYCHIATRIC: The patient is alert and awake. Confused SKIN: No obvious rash, lesion, or ulcer. Bruising   LABORATORY PANEL:   CBC  Recent Labs Lab 05/31/15 0505  WBC 6.2  HGB 13.2  HCT 38.9  PLT 72*    ------------------------------------------------------------------------------------------------------------------  Chemistries   Recent Labs Lab 06/02/15 0722  NA 137  K 3.8  CL 103  CO2 22  GLUCOSE 135*  BUN 57*  CREATININE 2.31*  CALCIUM 9.4  AST 24  ALT 17  ALKPHOS 100  BILITOT 2.8*   ------------------------------------------------------------------------------------------------------------------  Cardiac Enzymes No results for input(s): TROPONINI in the last 168 hours. ------------------------------------------------------------------------------------------------------------------  RADIOLOGY:  No results found.   ASSESSMENT AND PLAN:   1. Acute hepatic encephalopathy -. Ammonia continues to trend high at 142- 171--143-111 Continue lactulose orally and repeat ammonia levels . Fall precautions Appreciate GI recommendations- ESLD , liver is not processing meds Fentanyl patch discontinued for now Palliative care- changed code status to DNR and recommends hospice care at home , MELD score is very high at 22  Once ammonial levels are better and patient is clinically stable, we will discharge the patient home with hospice care  2. History of liver cirrhosis secondary to [facility] with history of esophageal varices patient gets weekly paracentesis on every Wednesday. Abdomen is nontender and doesn't seem distended. No need for paracentesis at this time. Continue lactulose and xifixan  3. Thrombocytopenia with no bleeding or bruises secondary to liver cirrhosis Monitor platelet count closely  4. CKD stage III is stable   5. GERD-PPI   6. COPD-no exacerbation at this time. Provide breathing treatments as needed  Disposition home with hospice care-daughter is the healthcare power of attorney  All the records are reviewed and case discussed with Care Management/Social Workerr. Management plans discussed with the patient, family and they are in  agreement.  CODE STATUS: DNR  DVT Prophylaxis: SCDs  TOTAL TIME TAKING CARE OF THIS PATIENT: 74minutes.   POSSIBLE D/C IN 1-2 DAYS, DEPENDING ON CLINICAL CONDITION.   Nicholes Mango M.D on 06/02/2015 at 4:21 PM  Between 7am to 6pm - Pager - [phone]  After 6pm go to [email or web address] - password [email or web address] [facility]  Office  [phone]  CC: Primary care physician; Dion Body, MD    Note: This dictation was prepared with Dragon dictation along with smaller phrase technology. Any transcriptional errors that result from this process are unintentional.

## 2015-06-02 NOTE — Progress Notes (Signed)
Nutrition Follow-up  DOCUMENTATION CODES:   Severe malnutrition in context of chronic illness  INTERVENTION:   Medical Food Supplement Therapy: pt receiving Mighty shakes TID on meal trays, taking some sips. Recommend addition of Magic Cup TID on meal trays as well Meals and Snacks: Cater to patient preferences, send yogurt TID on meal trays per family request. When alert, recommend smaller, more frequent meals  NUTRITION DIAGNOSIS:   Inadequate oral intake related to acute illness as evidenced by per patient/family report.  GOAL:   Patient will meet greater than or equal to 90% of their needs  MONITOR:    (Energy intake, Gastrointestinal profile)  REASON FOR ASSESSMENT:   Diagnosis, Malnutrition Screening Tool    ASSESSMENT:    Pt with ESLD, pt drowsy/lethargic but arousable, although did not participate in assessment today, family at bedside. Palliative care consulted; pt has been evaluated by hospice with plan for discharge home with hospice   Diet Order:  DIET - DYS 1 Room service appropriate?: Yes; Fluid consistency:: Thin; SLP follwing  Energy Intake: pt eating bites/sips if that, not wanting to eat anything. Receiving Mighty  Shakes, taking sips. Family reports at home pt likes to drink Pediasure at home instead of Ensure. Sips and bites mostly since admission  Electrolyte and Renal Profile:  Recent Labs Lab 05/31/15 0505 06/01/15 0450 06/02/15 0722  BUN 45* 49* 57*  CREATININE 1.97* 2.23* 2.31*  NA 138 136 137  K 3.9 4.2 3.8   Glucose Profile: No results for input(s): GLUCAP in the last 72 hours.   Meds: xifaxan, reglan, lactulose  Height:   Ht Readings from Last 1 Encounters:  05/30/15 5\' 1"  (1.549 m)    Weight:   Wt Readings from Last 1 Encounters:  05/30/15 120 lb 1.6 oz (54.477 kg)    BMI:  Body mass index is 22.7 kg/(m^2).  Estimated Nutritional Needs:   Kcal:  BEE 952 kcals (IF 1.0-1.3, AF 1.2) [id number] kcals/d  Protein:  (1.0-1.3  g/kg) 54-65 g/d  Fluid:  (25-4ml/kg) 1350-1645ml/d  EDUCATION NEEDS:   No education needs identified at this time  [place], RD, LDN [phone] Pager  [phone] Weekend/On-Call Pager

## 2015-06-02 NOTE — Progress Notes (Signed)
Speech Therapy Note: reviewed chart noes; consulted NSG today re: pt's status. Pt continues to present as drowsy/lethargic; eyes closed mostly and rousing slightly to name. She moves in the bed and then goes back to sleep. Per family and chart note review patient is not eating. She refused anything to eat when asked "are you hungry" by family recently. She has taken some scheduled oral medications today; shehas required no PRN pain medications. She is incontinent of bowel and bladder. Pt does not appear appropriate for BSE at this time sec. to decreased alertness for full, safe participation in evaluation. Rec. Continue aspiration precautions w/ any oral intake. ST services will f/u tomorrow to continue to assess pt's appropriateness for oral diet.

## 2015-06-02 NOTE — Consult Note (Signed)
End stage liver disease, confusion, arousable, was on pain patch.VSS afebrile.  No new suggestions, Hospice may be needed.  Pt is DNR.  Dr. Candace Cruise on this weekend but will not see unless you call with a problem.

## 2015-06-02 NOTE — Progress Notes (Addendum)
Patient's Name: Susan May MRN: [id number] DOB: Oct 08, 1931 DOS: June 02, 2015 Admission Date: 05/30/2015 Attending Provider: Nicholes Mango, MD  Reason(s) for Evaluation: Chronic Pain Management Hospital Consult CC: Weakness and Altered Mental Status   HPI:  Susan May is a 82 y.o. year old, female patient, who returns today as an established patient. She has Cirrhosis ([place]); NASH (nonalcoholic steatohepatitis); Acute on chronic renal failure (HCC); GERD (gastroesophageal reflux disease); Anxiety; Encephalopathy, hepatic ([place]); Chronic pain; Long term current use of opiate analgesic; Encounter for therapeutic drug level monitoring; Hepatic cirrhosis ([place]); Esophageal varices in cirrhosis ([place]); History of cardiovascular surgery; Chronic congestive splenomegaly; Portal hypertensive gastropathy; Esophageal stenosis (H/O SCHATZKI'S RING); Thoracic spine pain; Chronic pain syndrome; Chronic abdominal pain (epigastric); Chronic low back pain; Failed back surgical syndrome 2; 8 mm Anterolisthesis of L4 over L5; Lumbar facet arthropathy; Lumbar facet syndrome; Chronic upper back pain; Long term prescription opiate use; Opiate use; Polypharmacy; Encounter for chronic pain management; Hepatic encephalopathy ([place]); and Protein-calorie malnutrition, severe on her problem list.. Her primarily concern today is the Weakness and Altered Mental Status   This patient is well known to my service as she has been getting her chronic pain management at our offices as an outpatient. The patient was recently admitted to the hospital 05/20/2015 due to hepatic encephalopathy.  Reported Pain Score: 0-No pain Reported level is compatible with observation     Chronic Pain Management Pharmacotherapy  Review: The patient is usually treated with Duragesic 25 mcg/h every 72 hours. She has been on this regimen for quite some time without any problems. Onset of action: Within expected pharmacological parameters Time to  Peak effect: Timing and results are as within normal expected parameters Effectiveness: Described as relatively effective, allowing for increase in activities of daily living (ADL) % Relief: More than 50% Side-effects or Adverse reactions: None reported Duration of action: Within normal limits for medication [place] PMP: Compliant with practice rules and regulations UDS Results: Compliant Treatment compliance: Compliant Substance Use Disorder (SUD) Risk Level: Low Pharmacologic Plan: Continue therapy as is  Lab Work  Illicit Drugs No results found for: THCU, COCAINSCRNUR, PCPSCRNUR, MDMA, AMPHETMU, METHADONE, ETOH  Inflammation Markers Lab Results  Component Value Date   ESRSEDRATE 8 08/27/2013    Renal Function Lab Results  Component Value Date   BUN 57* 06/02/2015   CREATININE 2.31* 06/02/2015   GFRAA 22* 06/02/2015   GFRNONAA 19* 06/02/2015    Hepatic Function Lab Results  Component Value Date   AST 24 06/02/2015   ALT 17 06/02/2015   ALBUMIN 3.3* 06/02/2015    Electrolytes Lab Results  Component Value Date   NA 137 06/02/2015   K 3.8 06/02/2015   CL 103 06/02/2015   CALCIUM 9.4 06/02/2015   MG 1.8 08/27/2013    Last 72 hours Results for orders placed or performed during the hospital encounter of 05/30/15 (from the past 72 hour(s))  Comprehensive metabolic panel     Status: Abnormal   Collection Time: 05/31/15  5:05 AM  Result Value Ref Range   Sodium 138 135 - 145 mmol/L   Potassium 3.9 3.5 - 5.1 mmol/L   Chloride 105 101 - 111 mmol/L   CO2 22 22 - 32 mmol/L   Glucose, Bld 123 (H) 65 - 99 mg/dL   BUN 45 (H) 6 - 20 mg/dL   Creatinine, Ser 1.97 (H) 0.44 - 1.00 mg/dL   Calcium 9.3 8.9 - 10.3 mg/dL   Total Protein 5.5 (L) 6.5 - 8.1  g/dL   Albumin 3.1 (L) 3.5 - 5.0 g/dL   AST 22 15 - 41 U/L   ALT 15 14 - 54 U/L   Alkaline Phosphatase 97 38 - 126 U/L   Total Bilirubin 3.4 (H) 0.3 - 1.2 mg/dL   GFR calc non Af Amer 23 (L) >60 mL/min   GFR calc Af Amer 26  (L) >60 mL/min    Comment: (NOTE) The eGFR has been calculated using the CKD EPI equation. This calculation has not been validated in all clinical situations. eGFR's persistently <60 mL/min signify possible Chronic Kidney Disease.    Anion gap 11 5 - 15  CBC     Status: Abnormal   Collection Time: 05/31/15  5:05 AM  Result Value Ref Range   WBC 6.2 3.6 - 11.0 K/uL   RBC 3.91 3.80 - 5.20 MIL/uL   Hemoglobin 13.2 12.0 - 16.0 g/dL   HCT 38.9 35.0 - 47.0 %   MCV 99.4 80.0 - 100.0 fL   MCH 33.7 26.0 - 34.0 pg   MCHC 33.9 32.0 - 36.0 g/dL   RDW 15.4 (H) 11.5 - 14.5 %   Platelets 72 (L) 150 - 440 K/uL  Protime-INR     Status: Abnormal   Collection Time: 05/31/15  5:05 AM  Result Value Ref Range   Prothrombin Time 17.8 (H) 11.4 - 15.0 seconds   INR 1.46   Ammonia     Status: Abnormal   Collection Time: 05/31/15  9:13 AM  Result Value Ref Range   Ammonia 145 (H) 9 - 35 umol/L  Comprehensive metabolic panel     Status: Abnormal   Collection Time: 06/01/15  4:50 AM  Result Value Ref Range   Sodium 136 135 - 145 mmol/L   Potassium 4.2 3.5 - 5.1 mmol/L   Chloride 104 101 - 111 mmol/L   CO2 21 (L) 22 - 32 mmol/L   Glucose, Bld 142 (H) 65 - 99 mg/dL   BUN 49 (H) 6 - 20 mg/dL   Creatinine, Ser 2.23 (H) 0.44 - 1.00 mg/dL   Calcium 9.3 8.9 - 10.3 mg/dL   Total Protein 5.8 (L) 6.5 - 8.1 g/dL   Albumin 3.2 (L) 3.5 - 5.0 g/dL   AST 22 15 - 41 U/L   ALT 14 14 - 54 U/L   Alkaline Phosphatase 104 38 - 126 U/L   Total Bilirubin 2.5 (H) 0.3 - 1.2 mg/dL   GFR calc non Af Amer 19 (L) >60 mL/min   GFR calc Af Amer 23 (L) >60 mL/min    Comment: (NOTE) The eGFR has been calculated using the CKD EPI equation. This calculation has not been validated in all clinical situations. eGFR's persistently <60 mL/min signify possible Chronic Kidney Disease.    Anion gap 11 5 - 15  Ammonia     Status: Abnormal   Collection Time: 06/01/15  5:17 AM  Result Value Ref Range   Ammonia 171 (H) 9 - 35 umol/L   Ammonia     Status: Abnormal   Collection Time: 06/01/15  1:03 PM  Result Value Ref Range   Ammonia 143 (H) 9 - 35 umol/L  Ammonia     Status: Abnormal   Collection Time: 06/02/15  7:22 AM  Result Value Ref Range   Ammonia 111 (H) 9 - 35 umol/L  Comprehensive metabolic panel     Status: Abnormal   Collection Time: 06/02/15  7:22 AM  Result Value Ref Range   Sodium 137  135 - 145 mmol/L   Potassium 3.8 3.5 - 5.1 mmol/L   Chloride 103 101 - 111 mmol/L   CO2 22 22 - 32 mmol/L   Glucose, Bld 135 (H) 65 - 99 mg/dL   BUN 57 (H) 6 - 20 mg/dL   Creatinine, Ser 2.31 (H) 0.44 - 1.00 mg/dL   Calcium 9.4 8.9 - 10.3 mg/dL   Total Protein 5.6 (L) 6.5 - 8.1 g/dL   Albumin 3.3 (L) 3.5 - 5.0 g/dL   AST 24 15 - 41 U/L   ALT 17 14 - 54 U/L   Alkaline Phosphatase 100 38 - 126 U/L   Total Bilirubin 2.8 (H) 0.3 - 1.2 mg/dL   GFR calc non Af Amer 19 (L) >60 mL/min   GFR calc Af Amer 22 (L) >60 mL/min    Comment: (NOTE) The eGFR has been calculated using the CKD EPI equation. This calculation has not been validated in all clinical situations. eGFR's persistently <60 mL/min signify possible Chronic Kidney Disease.    Anion gap 12 5 - 15  Ammonia     Status: Abnormal   Collection Time: 06/02/15  4:49 PM  Result Value Ref Range   Ammonia 136 (H) 9 - 35 umol/L    Allergies  Susan May is allergic to neurontin.  Meds  Scheduled Medications: . FLUoxetine  10 mg Oral Daily  . lactulose  30 g Oral Q6H  . metoCLOPramide  5 mg Oral QID  . pantoprazole  40 mg Oral QAC breakfast  . rifaximin  550 mg Oral Daily  . rOPINIRole  0.5 mg Oral BID  . sodium chloride  3 mL Intravenous Q12H  . spironolactone  25 mg Oral Daily    PRN Medications: albuterol, ALPRAZolam, fluticasone, ondansetron (ZOFRAN) IV, traMADol  Discontinued Medications: Medications Discontinued During This Encounter  Medication Reason  . fentaNYL (DURAGESIC - DOSED MCG/HR) 25 MCG/HR patch Duplicate  . fentaNYL (DURAGESIC -  DOSED MCG/HR) 25 MCG/HR patch Duplicate  . traMADol (ULTRAM) tablet 50 mg   . 0.9 %  sodium chloride infusion   . docusate sodium (COLACE) capsule 100 mg   . feeding supplement (ENSURE ENLIVE) (ENSURE ENLIVE) liquid 237 mL   . fentaNYL (DURAGESIC - dosed mcg/hr) patch 25 mcg     PFSH  Medical:  Susan May  has a past medical history of COPD (chronic obstructive pulmonary disease) ([place]); Shortness of breath dyspnea; Cancer ([place]); Cirrhosis of liver ([place]); GERD (gastroesophageal reflux disease); Anxiety (04/30/2015); Clinical depression (05/13/1899); Encephalopathy, hepatic ([place]) (04/30/2015); Esophageal varices in cirrhosis ([place]) (09/30/2012); Esophagitis (09/10/1988); Bowel disease (09/30/2012); Bleeding gums (01/13/2013); Bacterial overgrowth syndrome (09/30/2012); Abdominal dropsy (03/22/2013); Chronic congestive splenomegaly (01/13/2013); Dehydration (04/30/2015); Esophageal stenosis (H/O SCHATZKI'S RING) (05/13/1899); Fatty infiltration of liver (09/27/2013); Gastric prolapse (09/30/2012); H/O malignant neoplasm of skin (05/25/2012); NASH (nonalcoholic steatohepatitis); Periodontal disease (01/13/2013); Portal hypertensive gastropathy (09/30/2012); SCC (squamous cell carcinoma), arm (07/09/2012); and Thrombocytopenia ([place]) (01/13/2013). Family: family history includes Angina in her mother; Heart attack in her mother and sister; Hypertension in her mother; Lymphoma in her sister; Prostate cancer in her father. Surgical:  has past surgical history that includes Appendectomy; Vaginal hysterectomy; and Back surgery. Tobacco:  reports that she quit smoking about 25 years ago. Her smoking use included Cigarettes. She started smoking about 46 years ago. She smoked 0.50 packs per day. She has never used smokeless tobacco. Alcohol:  reports that she does not drink alcohol. Drug:  reports that she does not use illicit drugs.  Physical Exam  Vitals:  Today's Vitals   06/01/15 2037 06/01/15 2139 06/02/15 0805 06/02/15 1122   BP: 121/51  118/57 130/63  Pulse: 94  99 99  Temp: 97.7 F (36.5 C)  99.1 F (37.3 C) 99.1 F (37.3 C)  TempSrc: Oral  Oral   Resp: _0 Height:      Weight:      SpO2: 97%  97% 98%  PainSc:  0-No pain      Calculated BMI: Body mass index is 22.7 kg/(m^2).  General appearance: appears stated age, delirious, no distress and the patient was asleep at the time of the visit. She seemed to be resting comfortably and did not seem to be in any acute distress. Respiratory rate and rhythm seemed to be within normal limits. Eyes: PERLA Respiratory: No evidence respiratory distress, no audible rales or ronchi and no use of accessory muscles of respiration  Cervical Spine Inspection: Normal anatomy Alignment: Symetrical Palpation: WNL ROM: Adequate  Upper Extremities Inspection: No gross anomalies detected ROM: Adequate  Thoracic Spine Inspection: No gross anomalies detected Alignment: Symetrical Palpation: WNL ROM: Decreased  Lumbar Spine Inspection: No gross anomalies detected Alignment: Symetrical  Lower Extremities Inspection: No gross anomalies detected  Assessment  Pain Problem List: Susan May has Chronic pain; Thoracic spine pain; Chronic pain syndrome; Chronic abdominal pain (epigastric); Chronic low back pain; Failed back surgical syndrome 2; 8 mm Anterolisthesis of L4 over L5; Lumbar facet arthropathy; Lumbar facet syndrome; and Chronic upper back pain on her pertinent problem list.  Visit Diagnosis: The primary encounter diagnosis was Hepatic encephalopathy ([place]). Diagnoses of COPD (chronic obstructive pulmonary disease) ([place]), Altered mental status, and SOB (shortness of breath) were also pertinent to this visit.  Recommendations / Plan   Interventional Pain Management Recommendations: The patient is confused and disoriented due to the hepatic encephalopathy. At this point she is not a candidate for any type of interventional therapies.     Pharmacological Recommendations: Fentanyl is possibly one of the best choices for the treatment of pain on patients with hepatic dysfunction. However, in this case this patient is encephalopathic and therefore her ability to perceive pain is impaired. As I understand, part of the consult was to determine if the pain medication was somewhat responsible for the patient's current status and somnolence. This is highly unlikely on somebody that has been on the medication for as long of this patient has without any prior incidents. However if it is necessary to determine if the medication is contributing to the patient's current status, it is more than appropriate to discontinue it until the patient begins to show signs of pain. At this point, there are none. Please see the table that I have added at the bottom for further guidance in terms of pain management and patients with hepatic dysfunction. Be extremely careful with the use of tramadol in patients with hepatic dysfunction. Metabolism of tramadol and M1 is reduced in patients with advanced cirrhosis of the liver, resulting in both a larger area under the concentration time curve for tramadol and longer tramadol and M1 elimination half-lives (13 hrs. for tramadol and 19 hrs. for M1). In cirrhotic patients, adjustment of the dosing regimen is recommended. Careful follow-up is required to check for signs of sedation and constipation (a risk for precipitating encephalopathy).   Pain Management Follow-up: At this point, there is no need for me to follow up with this patient any further in the hospital. as soon as the patient recovers, please put  the patient back on her Duragesic at exactly the same dose and rate that she had before. I will follow-up with her as an outpatient.    Primary Care Physician: Dion Body, MD Location: [facility] Outpatient Pain Management Facility Note by: Kathlen Brunswick Dossie Arbour, M.D, DABA, DABAPM, DABPM, DABIPP, FIPP  Hepatic  Dysfunction and opioids [Demerol PI 2002; Dolophine PI 2006; Guay et al.1988; Caryl Comes and [place], 1971; Murphy 2005; Propoxyphene PI 2005; Tegeder et al. 1999]  Opioid Recommended Usage Comment Dosing Recommendations*  Morphine Use cautiously and monitor patient for sedation. In severe hepatic impairment, the parent drug may not be readily converted to metabolites. Increase the dosing interval by twice the usual time period.  Hydromorphone/ Hydrocodone Use cautiously and monitor patient carefully for symptoms of opioid overdose. In severe hepatic impairment, the parent drug may not be readily converted to inactive metabolites. Decrease initial dose by 50% of the usual amount.  Oxycodone Use cautiously and monitor patient carefully for symptoms of opioid overdose In severe hepatic impairment, the parent drug may not be readily converted to inactive metabolites. Decrease initial dose by 1/2 to 1/3 of the usual amount.  Codeine Avoid use. In severe hepatic impairment, codeine may not be converted to the active metabolite, morphine. ---  Methadone Not advised. Not advised in severe liver failure due to risk of methadone accumulation. ---  Fentanyl Appears safe, generally no dose adjustment necessary. Decreased hepatic blood flow affects metabolism more than hepatic failure. Dosing adjustment usually not needed.  Meperidine Do not use. Inactive metabolite is associated with risk of seizure. ---  Propoxyphene Do not use. Hepatotoxicity reported with or without acetaminophen component. ---  *Recommended dose in severe hepatic impairment.

## 2015-06-02 NOTE — Care Management Note (Signed)
Case Management Note  Patient Details  Name: Susan May MRN: [id number] Date of Birth: 1932/01/21  Subjective/Objective:  Spoke with daughter, [name]. Plan is for patient to be discharged home with hospice services at discharge. Daughter prefers [facility]. Referral to Flo Shanks at hospice.                 Action/Plan: Following progression. Discharge date unknown at this time.  Expected Discharge Date:                  Expected Discharge Plan:  Home w Hospice Care  In-House Referral:     Discharge planning Services  CM Consult  Post Acute Care Choice:  Hospice Choice offered to:  Adult Children  DME Arranged:    DME Agency:     HH Arranged:    HH Agency:  [facility]  Status of Service:  In process, will continue to follow  Medicare Important Message Given:  Yes Date Medicare IM Given:    Medicare IM give by:    Date Additional Medicare IM Given:    Additional Medicare Important Message give by:     If discussed at [place] of Stay Meetings, dates discussed:    Additional Comments:  Jolly Mango, RN 06/02/2015, 1:17 PM

## 2015-06-02 NOTE — Progress Notes (Signed)
Remains arousable to verbal stimuli but no following of commands for further neuro assessment.  Movement of all extremities noted.  Tolerating po meds;continues of have multiple loose stools.

## 2015-06-02 NOTE — Care Management Important Message (Signed)
Important Message  Patient Details  Name: Susan May MRN: [id number] Date of Birth: 1931-10-27   Medicare Important Message Given:  Yes    Kiylah Loyer A, RN 06/02/2015, 8:06 AM

## 2015-06-02 NOTE — Progress Notes (Signed)
Palliative Care Follow-up- Plan continues to be for patient to discharge home with Hospice services once stable. Speech has evaluated and been in contact with patient's daughter which was family's only request prior to discharge. Discussed with care team all aware and in agreement with this plan. Will continue to follow throughout discharge.  Atha Starks, MSW, LCSW Palliative Care Social Worker

## 2015-06-03 LAB — AMMONIA: AMMONIA: 106 umol/L — AB (ref 9–35)

## 2015-06-03 MED ORDER — SODIUM CHLORIDE 0.9 % IV SOLN
INTRAVENOUS | Status: DC
  Administered 2015-06-03 – 2015-06-05 (×3): via INTRAVENOUS

## 2015-06-03 NOTE — Progress Notes (Signed)
Pt weak and lethargic. Appetite poor. Unable to take meds but family persevered and good lactulose dose in over period of 90 minutes. Urinary output scant. ivf's started a t 50 / hr. Family at bedside and are very supportive. Vss/ tele nsr with occasional pac's.

## 2015-06-03 NOTE — Progress Notes (Signed)
[facility] [place] NAME: Susan May    MR#:  [id number]  DATE OF BIRTH:  1931-09-18  SUBJECTIVE:  CHIEF COMPLAINT:   Chief Complaint  Patient presents with  . Weakness  . Altered Mental Status   Patient is still confused , getting lactulose as per schedule , ammonia level is trending down today.  Had 2 BM last evening. Daughter and grand daughter in room, as per them- this is not her baseline.  Pt is drowsy.  REVIEW OF SYSTEMS:    Review of Systems  Unable to perform ROS: mental status change    DRUG ALLERGIES:   Allergies  Allergen Reactions  . Neurontin [Gabapentin] Other (See Comments)    Increased confusion per the family    VITALS:  Blood pressure 116/52, pulse 94, temperature 97.6 F (36.4 C), temperature source Oral, resp. rate 18, height 5\' 1"  (1.549 m), weight 54.477 kg (120 lb 1.6 oz), SpO2 96 %.  PHYSICAL EXAMINATION:   Physical Exam  GENERAL:  82 y.o.-year-old patient lying in the bed with no acute distress.  EYES: Pupils equal, round, reactive to light and accommodation.Positive scleral icterus.   HEENT: Head atraumatic, normocephalic. Oropharynx and nasopharynx clear.  NECK:  Supple, no jugular venous distention. No thyroid enlargement, no tenderness.  LUNGS: Normal breath sounds bilaterally, no wheezing, rales, rhonchi. No use of accessory muscles of respiration.  CARDIOVASCULAR: S1, S2 normal. No murmurs, rubs, or gallops.  ABDOMEN: Soft, nontender, nondistended. Bowel sounds present. No organomegaly or mass.  EXTREMITIES: No cyanosis, clubbing or edema b/l.    NEUROLOGIC: Drowsy, Arousable to stimuli, moves limbs to stimuli, but does not follow commands. PSYCHIATRIC: The patient is Drowsy. Confused SKIN: No obvious rash, lesion, or ulcer. Bruising   LABORATORY PANEL:   CBC  Recent Labs Lab 05/31/15 0505  WBC 6.2  HGB 13.2  HCT 38.9  PLT 72*    ------------------------------------------------------------------------------------------------------------------  Chemistries   Recent Labs Lab 06/02/15 0722  NA 137  K 3.8  CL 103  CO2 22  GLUCOSE 135*  BUN 57*  CREATININE 2.31*  CALCIUM 9.4  AST 24  ALT 17  ALKPHOS 100  BILITOT 2.8*   ------------------------------------------------------------------------------------------------------------------  Cardiac Enzymes No results for input(s): TROPONINI in the last 168 hours. ------------------------------------------------------------------------------------------------------------------  RADIOLOGY:  No results found.   ASSESSMENT AND PLAN:   1. Acute hepatic encephalopathy -. Ammonia continues to trend high at 142- 171--143-111 Continue lactulose orally and repeat ammonia levels . Fall precautions Appreciate GI recommendations- ESLD , liver is not processing meds Fentanyl patch discontinued for now Palliative care- changed code status to DNR and recommends hospice care at home , MELD score is very high at 22  Once ammonial levels are better and patient is clinically stable, we will discharge the patient home with hospice care If she does not improve, may have to consider hospice home.  2. History of liver cirrhosis secondary to [facility] with history of esophageal varices patient gets weekly paracentesis on every Wednesday. Abdomen is nontender and doesn't seem distended. No need foe paracentesis for last 3 weeks. No need for paracentesis at this time. Continue lactulose and xifixan  3. Thrombocytopenia with no bleeding or bruises secondary to liver cirrhosis Monitor platelet count closely  4. CKD stage III is stable   5. GERD-PPI   6. COPD-no exacerbation at this time. Provide breathing treatments as needed  Disposition home with hospice care-daughter is the healthcare power of attorney  All  the records are reviewed and case discussed with Care  Management/Social Workerr. Management plans discussed with the patient, family and they are in agreement.  CODE STATUS: DNR  DVT Prophylaxis: SCDs  TOTAL TIME TAKING CARE OF THIS PATIENT: 31minutes.   POSSIBLE D/C IN 1-2 DAYS, DEPENDING ON CLINICAL CONDITION.   Vaughan Basta M.D on 06/03/2015 at 5:30 PM  Between 7am to 6pm - Pager - [phone].  After 6pm go to [email or web address] - password [email or web address]  Office  [phone]  CC: Primary care physician; Dion Body, MD    Note: This dictation was prepared with Dragon dictation along with smaller phrase technology. Any transcriptional errors that result from this process are unintentional.

## 2015-06-04 LAB — CBC
HEMATOCRIT: 36.7 % (ref 35.0–47.0)
HEMOGLOBIN: 12.4 g/dL (ref 12.0–16.0)
MCH: 33.9 pg (ref 26.0–34.0)
MCHC: 33.7 g/dL (ref 32.0–36.0)
MCV: 100.8 fL — ABNORMAL HIGH (ref 80.0–100.0)
Platelets: 48 10*3/uL — ABNORMAL LOW (ref 150–440)
RBC: 3.64 MIL/uL — AB (ref 3.80–5.20)
RDW: 16 % — ABNORMAL HIGH (ref 11.5–14.5)
WBC: 6.7 10*3/uL (ref 3.6–11.0)

## 2015-06-04 LAB — AMMONIA: AMMONIA: 104 umol/L — AB (ref 9–35)

## 2015-06-04 NOTE — Progress Notes (Signed)
[facility] at [place] NAME: Susan May    MR#:  [id number]  DATE OF BIRTH:  [birth year]  SUBJECTIVE:  CHIEF COMPLAINT:   Chief Complaint  Patient presents with  . Weakness  . Altered Mental Status   Patient is still confused , getting lactulose as per schedule , ammonia level is trending down very slowly. More alert today,and ate some.   REVIEW OF SYSTEMS:    Review of Systems  Unable to perform ROS: mental status change    DRUG ALLERGIES:   Allergies  Allergen Reactions  . Neurontin [Gabapentin] Other (See Comments)    Increased confusion per the family    VITALS:  Blood pressure 116/52, pulse 79, temperature 97.3 F (36.3 C), temperature source Oral, resp. rate 16, height 5\' 1"  (1.549 m), weight 54.477 kg (120 lb 1.6 oz), SpO2 98 %.  PHYSICAL EXAMINATION:   Physical Exam  GENERAL:  82 y.o.-year-old patient lying in the bed with no acute distress.  EYES: Pupils equal, round, reactive to light and accommodation.Positive scleral icterus.   HEENT: Head atraumatic, normocephalic. Oropharynx and nasopharynx clear.  NECK:  Supple, no jugular venous distention. No thyroid enlargement, no tenderness.  LUNGS: Normal breath sounds bilaterally, no wheezing, rales, rhonchi. No use of accessory muscles of respiration.  CARDIOVASCULAR: S1, S2 normal. No murmurs, rubs, or gallops.  ABDOMEN: Soft, nontender, nondistended. Bowel sounds present. No organomegaly or mass.  EXTREMITIES: No cyanosis, clubbing or edema b/l.    NEUROLOGIC: Drowsy, Arousable to stimuli, moves limbs to stimuli, but does not follow commands. PSYCHIATRIC: The patient is Drowsy. Confused SKIN: No obvious rash, lesion, or ulcer. Bruising   LABORATORY PANEL:   CBC  Recent Labs Lab 06/04/15 0503  WBC 6.7  HGB 12.4  HCT 36.7  PLT 48*    ------------------------------------------------------------------------------------------------------------------  Chemistries   Recent Labs Lab 06/02/15 0722  NA 137  K 3.8  CL 103  CO2 22  GLUCOSE 135*  BUN 57*  CREATININE 2.31*  CALCIUM 9.4  AST 24  ALT 17  ALKPHOS 100  BILITOT 2.8*   ------------------------------------------------------------------------------------------------------------------  Cardiac Enzymes No results for input(s): TROPONINI in the last 168 hours. ------------------------------------------------------------------------------------------------------------------  RADIOLOGY:  No results found.   ASSESSMENT AND PLAN:   1. Acute hepatic encephalopathy -. Ammonia continues to stay high  Continue lactulose orally and repeat ammonia levels . Fall precautions Appreciate GI recommendations- ESLD , liver is not processing meds Fentanyl patch discontinued for now- may resume on d/c Palliative care- changed code status to DNR and recommends hospice care at home , MELD score is very high at 22  Once ammonial levels are better and patient is clinically stable, we will discharge the patient home with hospice care If she does not improve, may have to consider hospice home, which looks more likely outcome now.  2. History of liver cirrhosis secondary to [facility] with history of esophageal varices patient gets weekly paracentesis on every Wednesday. Abdomen is nontender and doesn't seem distended. No need for paracentesis for last 3 weeks. No need for paracentesis at this time. Continue lactulose and xifixan  3. Thrombocytopenia with no bleeding or bruises secondary to liver cirrhosis Monitor platelet count closely  4. CKD stage III is stable    COnt IV fluids.  5. GERD-PPI   6. COPD-no exacerbation at this time. Provide breathing treatments as needed  7.failure to thrive   This may justify her going to hospice home.    Will  wait for pallaitive  care.  Disposition home with hospice care-daughter is the healthcare power of attorney  All the records are reviewed and case discussed with Care Management/Social Workerr. Management plans discussed with the patient, family and they are in agreement.  CODE STATUS: DNR  DVT Prophylaxis: SCDs  TOTAL TIME TAKING CARE OF THIS PATIENT: 66minutes.   POSSIBLE D/C IN 1-2 DAYS, DEPENDING ON CLINICAL CONDITION.   Vaughan Basta M.D on 06/04/2015 at 10:26 PM  Between 7am to 6pm - Pager - [phone].  After 6pm go to [email or web address] - password [email or web address] [facility]  Office  [phone]  CC: Primary care physician; Dion Body, MD    Note: This dictation was prepared with Dragon dictation along with smaller phrase technology. Any transcriptional errors that result from this process are unintentional.

## 2015-06-05 LAB — AMMONIA: Ammonia: 99 umol/L — ABNORMAL HIGH (ref 9–35)

## 2015-06-05 LAB — COMPREHENSIVE METABOLIC PANEL
ALBUMIN: 2.9 g/dL — AB (ref 3.5–5.0)
ALT: 17 U/L (ref 14–54)
ANION GAP: 5 (ref 5–15)
AST: 20 U/L (ref 15–41)
Alkaline Phosphatase: 88 U/L (ref 38–126)
BUN: 78 mg/dL — ABNORMAL HIGH (ref 6–20)
CHLORIDE: 112 mmol/L — AB (ref 101–111)
CO2: 24 mmol/L (ref 22–32)
Calcium: 8.7 mg/dL — ABNORMAL LOW (ref 8.9–10.3)
Creatinine, Ser: 1.81 mg/dL — ABNORMAL HIGH (ref 0.44–1.00)
GFR calc non Af Amer: 25 mL/min — ABNORMAL LOW (ref >60)
GFR, EST AFRICAN AMERICAN: 29 mL/min — AB (ref >60)
GLUCOSE: 119 mg/dL — AB (ref 65–99)
Potassium: 3.4 mmol/L — ABNORMAL LOW (ref 3.5–5.1)
SODIUM: 141 mmol/L (ref 135–145)
Total Bilirubin: 1.5 mg/dL — ABNORMAL HIGH (ref 0.3–1.2)
Total Protein: 4.9 g/dL — ABNORMAL LOW (ref 6.5–8.1)

## 2015-06-05 MED ORDER — MORPHINE SULFATE 20 MG/5ML PO SOLN: 5.0000 mg | ORAL | Status: AC | PRN

## 2015-06-05 MED ORDER — FENTANYL 25 MCG/HR TD PT72: 25.0000 ug | MEDICATED_PATCH | TRANSDERMAL | Status: DC

## 2015-06-05 MED ORDER — BISACODYL 10 MG RE SUPP: 10.0000 mg | RECTAL | Status: AC | PRN

## 2015-06-05 MED ORDER — PROCHLORPERAZINE 25 MG RE SUPP: 25.0000 mg | Freq: Three times a day (TID) | RECTAL | Status: AC | PRN

## 2015-06-05 MED ORDER — MORPHINE SULFATE 20 MG/5ML PO SOLN: 2.5000 mg | ORAL | Status: DC | PRN

## 2015-06-05 MED ORDER — GLYCOPYRROLATE 1 MG PO TABS: 1.0000 mg | ORAL_TABLET | Freq: Three times a day (TID) | ORAL | Status: AC | PRN

## 2015-06-05 NOTE — Care Management Note (Signed)
Case Management Note  Patient Details  Name: Susan May MRN: [id number] Date of Birth: 01-19-1932  Subjective/Objective:   Spoke with daughter [name] at bedside. She states she spoke with Dr. Marthann Schiller this morning and was made aware of discharge. [name] states she notified her sister [name] of discharge. All hospice services are in place for home care.              Action/Plan: Home with [facility]. Case closed  Expected Discharge Date:     06/05/2015             Expected Discharge Plan:  Home w Hospice Care  In-House Referral:     Discharge planning Services  CM Consult  Post Acute Care Choice:  Hospice Choice offered to:  Adult Children  DME Arranged:    DME Agency:     HH Arranged:    HH Agency:  [facility]  Status of Service:  Completed, signed off  Medicare Important Message Given:  Yes Date Medicare IM Given:    Medicare IM give by:    Date Additional Medicare IM Given:    Additional Medicare Important Message give by:     If discussed at [place] of Stay Meetings, dates discussed:    Additional Comments:  Jolly Mango, RN 06/05/2015, 11:32 AM

## 2015-06-05 NOTE — Care Management Note (Signed)
Case Management Note  Patient Details  Name: Susan May MRN: [id number] Date of Birth: 07-Jan-1932  Subjective/Objective:  Notified Flo Shanks with [facility] that patient has discharge orders. She will  speak with the family to make sure all equipment has been delivered.                  Action/Plan: Updated CSW of discharge today.   Expected Discharge Date:                  Expected Discharge Plan:  Home w Hospice Care  In-House Referral:     Discharge planning Services  CM Consult  Post Acute Care Choice:  Hospice Choice offered to:  Adult Children  DME Arranged:    DME Agency:     HH Arranged:    HH Agency:  [facility]  Status of Service:  In process, will continue to follow  Medicare Important Message Given:  Yes Date Medicare IM Given:    Medicare IM give by:    Date Additional Medicare IM Given:    Additional Medicare Important Message give by:     If discussed at [place] of Stay Meetings, dates discussed:    Additional Comments:  Jolly Mango, RN 06/05/2015, 9:58 AM

## 2015-06-05 NOTE — Progress Notes (Signed)
Notified by [facility] of plan for patient to discharge home today. Writer contacted patient's daughter [name] [phone]) who was unaware of discharge. She did confirm that the hospital bed was delivered on Saturday to her home as ordered. [name] will be at work until 3 pm, she has requested that discharge be held until that time if possible. This information was given to [facility]. Discharge summary faxed to referral intake. Thank you. Flo Shanks RN, BSN, [place] and Pall[facility] of [place], [facility] [phone] c

## 2015-06-05 NOTE — Progress Notes (Signed)
Patient has rested quietly tonight. No complaints of pain and no signs of discomfort or distress noted. Patient was more alert at start of shift and is easier to arouse. Daughter at bedside. Nursing staff will continue to monitor. Earleen Reaper, RN

## 2015-06-05 NOTE — Progress Notes (Signed)
Palliative Medicine Inpatient Consult Follow Up Note   Name: Susan May Date: 06/05/2015 MRN: [id number]  DOB: [birth year]  Referring Physician: Vaughan Basta, MD  Palliative Care consult requested for this 83 y.o. female for goals of medical therapy in patient with hepatic encephalopathy and dementia.    TODAY'S PLAN AND ACTIONS: 1. Pt is DNR  2. The daughter who provides most of the 'hands on' caregiving had stated during our first meeting that she wanted to care for her mother at home (not hospice home or a facility). She is due to be discharged there today. Everything has been set up for this by [place] (as of this past Friday).    3.  Pt's DC meds have been discussed with Dr Anselm Jungling, Hospice Liaison, and with pts relatives who are bedside.  They are informed that some of pts usual meds have been DCd so pt may have more room in her stomach to eat --and to simplify her regimen. She is to continue what she can take that is most active against her cirrhosis (the lactulose and the Xifaxin). The family is advised that if some meds are left off the list that they feel she needs that they should discuss this matter with Hospice staff.  Pt has her usual meds at home.  The only new ones are Roxanol PRN and some other symptom management meds.   4.  Please note that pt has done better off of Fentanyl and that Dr. Anselm Jungling and daughter discussed leaving her off of this patch and just using morphine prn.  That is why there is to be no Fentanyl patch RX.    IMPRESSION: Hepatic Encephalopathy ---due to NASH related liver cirrhosis (total bilirubin is 2.8) ---MELD score is 22 which gives pt a 20% mortality likelihood within 37months time. There is not a 6 month calculation, but given that pt is not a transplant candidate at her age, she should qualify for hospice services because she has a high liklihood of not being alive 6 months from now ---has known esphageal varices   ---with ascites requiring weekly paracenteses on Wednesdays ---Ammonia level was 92 at time of admission Loss of appetite and poor oral intake (albumin 3.2 here) Acute Renal Failure on CKD stage 2- 3 (Cr 1.96 here with baseline possibly around 1.7) --due to dehydration COPD Thrombocytopenia Anxiety Depression H/O esophagitis ALSO HAS HX OF NISSAN FUNDIPLICATION PROCEDURE SO SHE CANNOT EAT LARGE VOLUMES OF FOOD IN ONE SITTING. Chronic congestive splenomegaly H/O Esophageal stricture H/O skin cancers: Melanoma, Squamous Cell Former smoker  GERD   REVIEW OF SYSTEMS:  Patient is not able to provide ROS --due to confusion.  She can at times relate pain or symptoms.   CODE STATUS: DNR   PAST MEDICAL HISTORY: Past Medical History  Diagnosis Date  . COPD (chronic obstructive pulmonary disease) ([place])   . Shortness of breath dyspnea   . Cancer ([place])     Hx: skin cancer x 2:  one melanoma    . Cirrhosis of liver ([place])   . GERD (gastroesophageal reflux disease)   . Anxiety 04/30/2015  . Clinical depression 05/13/1899  . Encephalopathy, hepatic ([place]) 04/30/2015  . Esophageal varices in cirrhosis ([place]) 09/30/2012  . Esophagitis 09/10/1988  . Bowel disease 09/30/2012  . Bleeding gums 01/13/2013  . Bacterial overgrowth syndrome 09/30/2012  . Abdominal dropsy 03/22/2013  . Chronic congestive splenomegaly 01/13/2013  . Dehydration 04/30/2015  . Esophageal stenosis (H/O SCHATZKI'S RING) 05/13/1899  . Fatty  infiltration of liver 09/27/2013  . Gastric prolapse 09/30/2012  . H/O malignant neoplasm of skin 05/25/2012    History- Non-Melanoma Skin Cancer of left dorsal hand treated with Mohs surgery 06-29-12   . NASH (nonalcoholic steatohepatitis)   . Periodontal disease 01/13/2013  . Portal hypertensive gastropathy 09/30/2012  . SCC (squamous cell carcinoma), arm 07/09/2012    SCCIS of left dorsal hand treated with Mohs surgery 06-29-12   . Thrombocytopenia ([place]) 01/13/2013    PAST SURGICAL HISTORY:   Past Surgical History  Procedure Laterality Date  . Appendectomy    . Vaginal hysterectomy    . Back surgery      Vital Signs: BP 136/57 mmHg  Pulse 79  Temp(Src) 98.1 F (36.7 C) (Oral)  Resp 17  Ht 5\' 1"  (1.549 m)  Wt 54.477 kg (120 lb 1.6 oz)  BMI 22.70 kg/m2  SpO2 95% Filed Weights   05/30/15 1141 05/30/15 1622  Weight: 55.792 kg (123 lb) 54.477 kg (120 lb 1.6 oz)    Estimated body mass index is 22.7 kg/(m^2) as calculated from the following:   Height as of this encounter: 5\' 1"  (1.549 m).   Weight as of this encounter: 54.477 kg (120 lb 1.6 oz).  PHYSICAL EXAM: Weak but alert in medical bed with multiple family members bedside EOMI OP clear No JVD or TM Hrt rrr no mgr Lungs cta ant Abd is protuberant today with BS Ext no mottling or cyanosis  LABS: CBC:    Component Value Date/Time   WBC 6.7 06/04/2015 0503   WBC 4.0 11/26/2013 1710   HGB 12.4 06/04/2015 0503   HGB 12.6 11/26/2013 1710   HCT 36.7 06/04/2015 0503   HCT 38.6 11/26/2013 1710   PLT 48* 06/04/2015 0503   PLT 84* 11/26/2013 1710   MCV 100.8* 06/04/2015 0503   MCV 95 11/26/2013 1710   NEUTROABS 2.8 04/30/2015 1645   NEUTROABS 2.3 11/26/2013 1710   LYMPHSABS 0.9* 04/30/2015 1645   LYMPHSABS 1.0 11/26/2013 1710   MONOABS 0.8 04/30/2015 1645   MONOABS 0.6 11/26/2013 1710   EOSABS 0.2 04/30/2015 1645   EOSABS 0.1 11/26/2013 1710   BASOSABS 0.0 04/30/2015 1645   BASOSABS 0.0 11/26/2013 1710   Comprehensive Metabolic Panel:    Component Value Date/Time   NA 141 06/05/2015 0520   NA 128* 11/26/2013 1710   K 3.4* 06/05/2015 0520   K 4.1 11/26/2013 1710   CL 112* 06/05/2015 0520   CL 94* 11/26/2013 1710   CO2 24 06/05/2015 0520   CO2 30 11/26/2013 1710   BUN 78* 06/05/2015 0520   BUN 13 11/26/2013 1710   CREATININE 1.81* 06/05/2015 0520   CREATININE 1.53* 04/22/2014 1335   GLUCOSE 119* 06/05/2015 0520   GLUCOSE 115* 11/26/2013 1710   CALCIUM 8.7* 06/05/2015 0520   CALCIUM 8.3*  11/26/2013 1710   AST 20 06/05/2015 0520   AST 34 11/26/2013 1710   ALT 17 06/05/2015 0520   ALT 20 11/26/2013 1710   ALKPHOS 88 06/05/2015 0520   ALKPHOS 103 11/26/2013 1710   BILITOT 1.5* 06/05/2015 0520   BILITOT 1.2* 11/26/2013 1710   PROT 4.9* 06/05/2015 0520   PROT 5.9* 11/26/2013 1710   ALBUMIN 2.9* 06/05/2015 0520   ALBUMIN 3.1* 11/26/2013 1710     More than 50% of the visit was spent in counseling/coordination of care: YES  Time Spent: 35 min

## 2015-06-05 NOTE — Progress Notes (Addendum)
CSW was informed by RN Case Manager that patient would need EMS transport at discharge. She reports that patient's daughter will not be home until after 3PM. CSW informed RN of above. [facility] will transport patient to [address]. [place], [place] [address] at discharge. CSW met with patient and her daughters at bedside to inform them that EMS will transport patient to the above address. DNR and RXs are located in discharge packet. RN is aware of above and will call [facility] after 3PM for transport.   Ernest Pine, MSW, [place] Social Work Department [phone]

## 2015-06-05 NOTE — Discharge Summary (Addendum)
[facility] NAME: Susan May    MR#:  [id number]  DATE OF BIRTH:  [birth year]  DATE OF ADMISSION:  05/30/2015 ADMITTING PHYSICIAN: Nicholes Mango, MD  DATE OF DISCHARGE: 06/05/2015  PRIMARY CARE PHYSICIAN: Dion Body, MD    ADMISSION DIAGNOSIS:  Hepatic encephalopathy ([place]) [K72.90] COPD (chronic obstructive pulmonary disease) ([place]) [J44.9] Altered mental status [R41.82]  DISCHARGE DIAGNOSIS:  Principal Problem:   Encephalopathy, hepatic ([place]) Active Problems:   Hepatic encephalopathy ([facility])   Protein-calorie malnutrition, severe  SECONDARY DIAGNOSIS:   Past Medical History  Diagnosis Date  . COPD (chronic obstructive pulmonary disease) ([place])   . Shortness of breath dyspnea   . Cancer ([place])     Hx: skin cancer x 2:  one melanoma    . Cirrhosis of liver ([place])   . GERD (gastroesophageal reflux disease)   . Anxiety 04/30/2015  . Clinical depression 80/80/8080  . Encephalopathy, hepatic ([place]) 04/30/2015  . Esophageal varices in cirrhosis ([place]) 09/30/2012  . Esophagitis 09/10/1988  . Bowel disease 09/30/2012  . Bleeding gums 01/13/2013  . Bacterial overgrowth syndrome 09/30/2012  . Abdominal dropsy 03/22/2013  . Chronic congestive splenomegaly 01/13/2013  . Dehydration 04/30/2015  . Esophageal stenosis (H/O SCHATZKI'S RING) 05/13/1899  . Fatty infiltration of liver 09/27/2013  . Gastric prolapse 09/30/2012  . H/O malignant neoplasm of skin 05/25/2012    History- Non-Melanoma Skin Cancer of left dorsal hand treated with Mohs surgery 06-29-12   . NASH (nonalcoholic steatohepatitis)   . Periodontal disease 01/13/2013  . Portal hypertensive gastropathy 09/30/2012  . SCC (squamous cell carcinoma), arm 07/09/2012    SCCIS of left dorsal hand treated with Mohs surgery 06-29-12   . Thrombocytopenia ([place]) 01/13/2013    HOSPITAL COURSE:   1. Acute hepatic encephalopathy -. Ammonia continues to stay high - slowly coming down-  99. Continue lactulose orally and repeat ammonia levels . Fall precautions Appreciate GI recommendations- ESLD , liver is not processing meds Fentanyl patch discontinued for now-  Palliative care- changed code status to DNR and recommends hospice care at home , MELD score is very high at 22  The patient looks much better and alert today, she still appears very weak and is not able to help herself in the bed, need complete care to change her diapers and feed her. Patient's daughter is present in the room, she understands the situation and accepts the prognosis. She told me that other family members also are aware about this situation and knowing that when she goes home she will need complete care and which would be mainly provided by the family members. They agree to have hospice services and nurse coming at home to help them through this phase. I also discussed with her about the pain management issue, she told me patient only complains of pain when her abdomen started swelling up with fluid, and they already have set up done as outpatient every Wednesday if she requires to go for the paracentesis. She was on fentanyl patch before she came in, but for last few days she is off from that give her mental status changes, and she is doing fine without any kind of pain medications.  So I explained to her daughter and she agreed about using oral liquid morphine only in case of pain, if required. This will help to keep patient more alert and gives little better quality to her so family can enjoy keeping her at home. We will discharge her  home today with hospice services.  2. History of liver cirrhosis secondary to [facility] with history of esophageal varices patient gets weekly paracentesis on every Wednesday. Abdomen is nontender and doesn't seem distended. No need for paracentesis for last 3 weeks. No need for paracentesis at this time. Continue lactulose and xifixan  3. Thrombocytopenia with no  bleeding or bruises secondary to liver cirrhosis Monitor platelet count closely- stable.  4. CKD stage III is stable   COnt IV fluids.   Slightly better than before.  5. GERD-PPI   6. COPD-no exacerbation at this time. Provide breathing treatments as needed  7.failure to thrive  As per daughter who is present in the room, patient was completely alert yesterday and she is alert today also and she had satisfactory amount of food for her. 82  Family will be providing her some support and feeding her at home.  DISCHARGE CONDITIONS:   [place].  CONSULTS OBTAINED:  Treatment Team:  Manya Silvas, MD  DRUG ALLERGIES:   Allergies  Allergen Reactions  . Neurontin [Gabapentin] Other (See Comments)    Increased confusion per the family    DISCHARGE MEDICATIONS:   Current Discharge Medication List    START taking these medications   Details  bisacodyl (DULCOLAX) 10 MG suppository Place 1 suppository (10 mg total) rectally as needed for moderate constipation. Qty: 6 suppository, Refills: 0    glycopyrrolate (ROBINUL) 1 MG tablet Take 1 tablet (1 mg total) by mouth 3 (three) times daily as needed (prn excessive secretions). Qty: 15 tablet, Refills: 0    morphine 20 MG/5ML solution Take 1.3 mLs (5.2 mg total) by mouth every 2 (two) hours as needed for pain. Do Not give, IF she is sleepy or drowsy. Qty: 30 mL, Refills: 0    prochlorperazine (COMPAZINE) 25 MG suppository Place 1 suppository (25 mg total) rectally every 8 (eight) hours as needed for nausea or vomiting. Qty: 6 suppository, Refills: 0      CONTINUE these medications which have NOT CHANGED   Details  albuterol (PROVENTIL HFA;VENTOLIN HFA) 108 (90 BASE) MCG/ACT inhaler Inhale 2 puffs into the lungs every 6 (six) hours as needed for wheezing or shortness of breath.    ALPRAZolam (XANAX) 0.25 MG tablet Take 1 tablet by mouth as needed.    fluticasone (FLONASE) 50 MCG/ACT nasal spray Place 2 sprays into both nostrils  daily as needed for rhinitis.     lactulose (CHRONULAC) 10 GM/15ML solution Take 30 g by mouth 3 (three) times daily.     ondansetron (ZOFRAN-ODT) 8 MG disintegrating tablet Take 8 mg by mouth every 8 (eight) hours as needed for nausea or vomiting.    rifaximin (XIFAXAN) 550 MG TABS tablet Take 550 mg by mouth daily.     spironolactone (ALDACTONE) 25 MG tablet Take 1 tablet by mouth daily.      STOP taking these medications     FLUoxetine (PROZAC) 10 MG capsule      furosemide (LASIX) 40 MG tablet      loratadine (CLARITIN) 10 MG tablet      metoCLOPramide (REGLAN) 5 MG tablet      pantoprazole (PROTONIX) 40 MG tablet      rOPINIRole (REQUIP) 0.5 MG tablet      fentaNYL (DURAGESIC - DOSED MCG/HR) 25 MCG/HR patch          DISCHARGE INSTRUCTIONS:    Follow with PMD, if needed. Hospice services to follow at home.  If you experience worsening of your admission symptoms,  develop shortness of breath, life threatening emergency, suicidal or homicidal thoughts you must seek medical attention immediately by calling 911 or calling your MD immediately  if symptoms less severe.  You Must read complete instructions/literature along with all the possible adverse reactions/side effects for all the Medicines you take and that have been prescribed to you. Take any new Medicines after you have completely understood and accept all the possible adverse reactions/side effects.   Please note  You were cared for by a hospitalist during your hospital stay. If you have any questions about your discharge medications or the care you received while you were in the hospital after you are discharged, you can call the unit and asked to speak with the hospitalist on call if the hospitalist that took care of you is not available. Once you are discharged, your primary care physician will handle any further medical issues. Please note that NO REFILLS for any discharge medications will be authorized once you  are discharged, as it is imperative that you return to your primary care physician (or establish a relationship with a primary care physician if you do not have one) for your aftercare needs so that they can reassess your need for medications and monitor your lab values.    Today   CHIEF COMPLAINT:   Chief Complaint  Patient presents with  . Weakness  . Altered Mental Status    HISTORY OF PRESENT ILLNESS:  Susan May  is a 82 y.o. female with a known history of liver cirrhosis, nash, GERD and COPD and multiple other medical problems is brought into the ED for progressively worsening mental status for the past one week and decreased by mouth intake for the past 2-3 days. Patient was unable to take her medications as she was confused. Here in the emergency department ammonia level is at 92. Grandchildren are sick with nausea and vomiting but patient has no nausea or vomiting. According to the daughter at bedside she was afebrile. Patient gets weekly paracentesis at 10:30 AM on Wednesdays, but distant patient's abdomen is not tight and not considering paracentesis. Patient is awake and alert but pleasantly confused   VITAL SIGNS:  Blood pressure 136/57, pulse 79, temperature 98.1 F (36.7 C), temperature source Oral, resp. rate 17, height 5\' 1"  (1.549 m), weight 54.477 kg (120 lb 1.6 oz), SpO2 95 %.  I/O:    Intake/Output Summary (Last 24 hours) at 06/05/15 1411 Last data filed at 06/05/15 1340  Gross per 24 hour  Intake 2559.17 ml  Output      0 ml  Net 2559.17 ml    PHYSICAL EXAMINATION:   GENERAL: 82 y.o.-year-old patient lying in the bed with no acute distress.  EYES: Pupils equal, round, reactive to light and accommodation.Positive scleral icterus.  HEENT: Head atraumatic, normocephalic. Oropharynx and nasopharynx clear.  NECK: Supple, no jugular venous distention. No thyroid enlargement, no tenderness.  LUNGS: Normal breath sounds bilaterally, no wheezing, rales,  rhonchi. No use of accessory muscles of respiration.  CARDIOVASCULAR: S1, S2 normal. No murmurs, rubs, or gallops.  ABDOMEN: Soft, nontender, nondistended. Bowel sounds present. No organomegaly or mass.  EXTREMITIES: No cyanosis, clubbing or edema b/l.  NEUROLOGIC: Drowsy, Arousable to stimuli, moves limbs to stimuli, but does not follow commands. PSYCHIATRIC: The patient is Drowsy. Confused SKIN: No obvious rash, lesion, or ulcer. Bruising   DATA REVIEW:   CBC  Recent Labs Lab 06/04/15 0503  WBC 6.7  HGB 12.4  HCT 36.7  PLT 48*  Chemistries   Recent Labs Lab 06/05/15 0520  NA 141  K 3.4*  CL 112*  CO2 24  GLUCOSE 119*  BUN 78*  CREATININE 1.81*  CALCIUM 8.7*  AST 20  ALT 17  ALKPHOS 88  BILITOT 1.5*    Cardiac Enzymes No results for input(s): TROPONINI in the last 168 hours.  Microbiology Results  No results found for this or any previous visit.  RADIOLOGY:  No results found.  EKG:   Orders placed or performed during the hospital encounter of 12/06/14  . ED EKG  . ED EKG  . EKG      Management plans discussed with the patient, family and they are in agreement.  CODE STATUS:     Code Status Orders        Start     Ordered   05/30/15 1621  Do not attempt resuscitation (DNR)   Continuous    Question Answer Comment  In the event of cardiac or respiratory ARREST Do not call a "code blue"   In the event of cardiac or respiratory ARREST Do not perform Intubation, CPR, defibrillation or ACLS   In the event of cardiac or respiratory ARREST Use medication by any route, position, wound care, and other measures to relive pain and suffering. May use oxygen, suction and manual treatment of airway obstruction as needed for comfort.      05/30/15 1620    Code Status History    Date Active Date Inactive Code Status Order ID Comments User Context   04/30/2015  9:15 PM 05/02/2015  3:25 PM DNR [id number]  Lance Coon, MD Inpatient   03/17/2015   8:15 PM 03/19/2015  4:58 PM DNR [id number]  Theodoro Grist, MD Inpatient   03/17/2015  7:56 PM 03/17/2015  8:15 PM Full Code [id number]  Theodoro Grist, MD Inpatient   10/05/2014  1:22 PM 10/06/2014  3:26 AM Full Code [id number]  Corrie Mckusick, DO HOV    Advance Directive Documentation        Most Recent Value   Type of Advance Directive  Out of facility DNR (pink MOST or yellow form)   Pre-existing out of facility DNR order (yellow form or pink MOST form)  Yellow form placed in chart (order not valid for inpatient use)   "MOST" Form in Place?        TOTAL TIME TAKING CARE OF THIS PATIENT: 35 minutes.    Vaughan Basta M.D on 06/05/2015 at 2:11 PM  Between 7am to 6pm - Pager - [phone]  After 6pm go to [email or web address] - password [email or web address] [facility]  Office  [phone]  CC: Primary care physician; Dion Body, MD   Note: This dictation was prepared with Dragon dictation along with smaller phrase technology. Any transcriptional errors that result from this process are unintentional.

## 2015-06-05 NOTE — Care Management Important Message (Signed)
Important Message  Patient Details  Name: SHARN PARLIN MRN: [id number] Date of Birth: 08-30-1931   Medicare Important Message Given:  Yes    Spiro Ausborn A, RN 06/05/2015, 8:30 AM

## 2015-06-07 ENCOUNTER — Ambulatory Visit: Admission: RE | Admit: 2015-06-07 | Payer: Medicare Other | Source: Ambulatory Visit

## 2015-07-12 DEATH — deceased

## 2015-07-18 ENCOUNTER — Encounter: Payer: Medicare Other | Admitting: Pain Medicine

## 2015-08-15 ENCOUNTER — Encounter: Payer: Medicare Other | Admitting: Pain Medicine

## 2016-04-29 IMAGING — US US PARACENTESIS
1 series · 5 of 5 positions shown · non-contrast
Comparison: Multiple prior ultrasound-guided paracenteses, most
recently, 12/07/2014

MEDICATIONS:
None.

COMPLICATIONS:
None immediate

INDICATION: Recurrent symptomatic ascites

EXAM:
ULTRASOUND-GUIDED PARACENTESIS
TECHNIQUE: Informed written consent was obtained from the patient after a
discussion of the risks, benefits and alternatives to treatment. A
timeout was performed prior to the initiation of the procedure.

[Series 1: us paracentesis · 0.28mm/px · 5 of 5 slices shown]
[im 1/5]
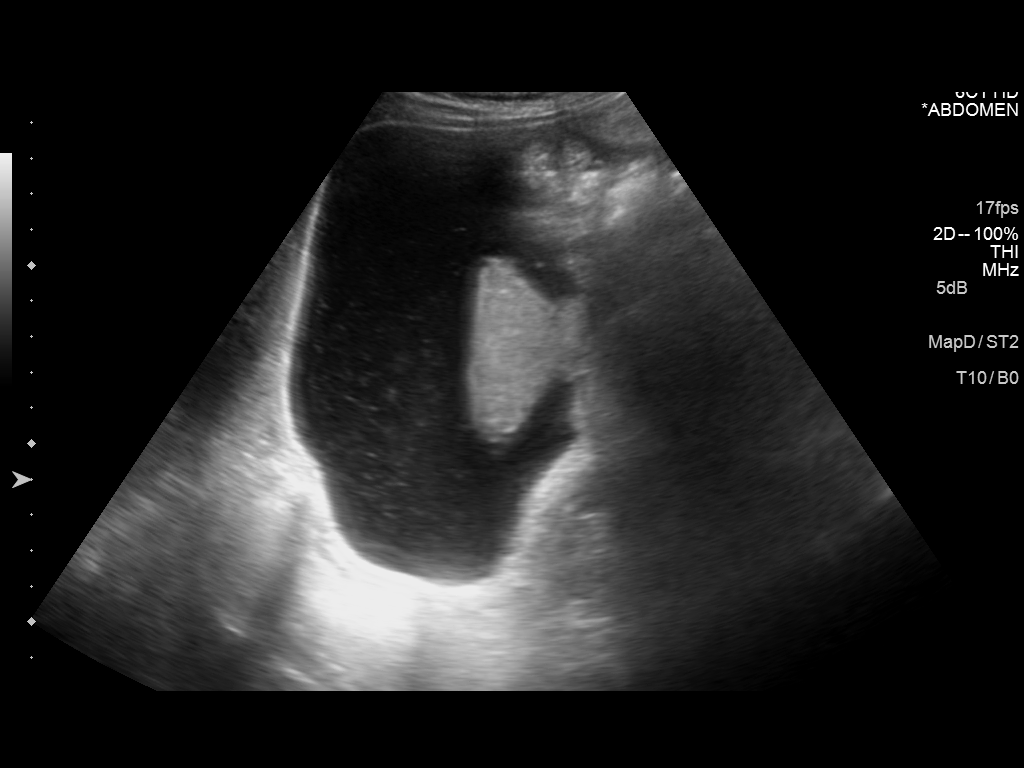
[im 2/5]
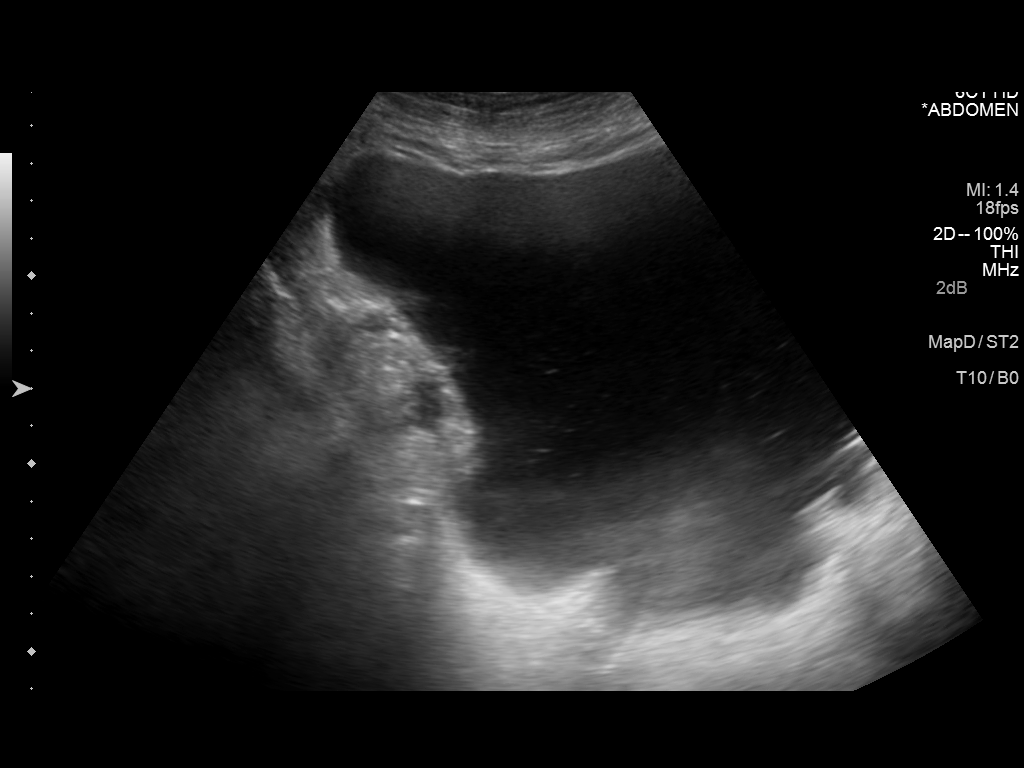
[im 3/5]
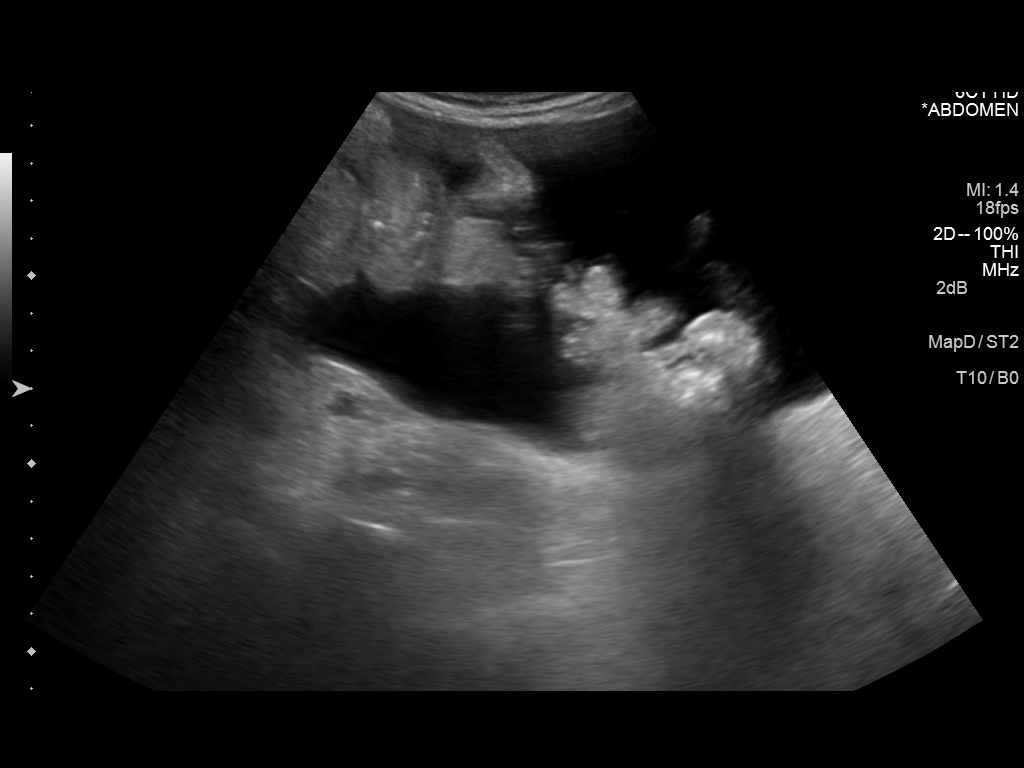
[im 4/5]
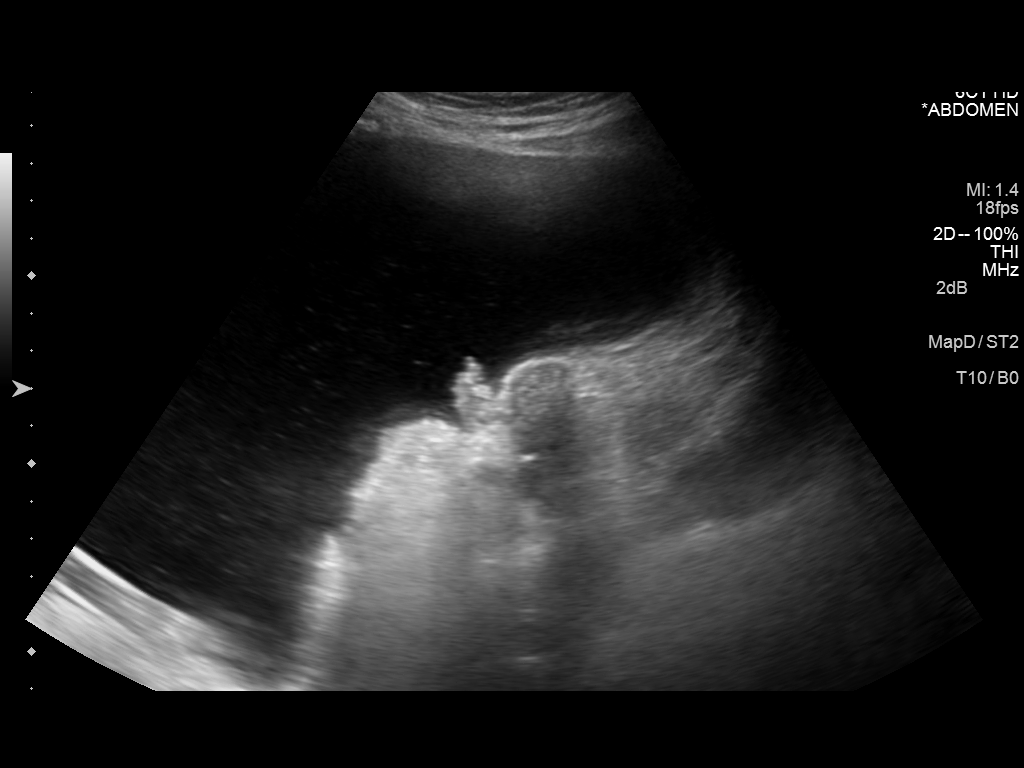
[im 5/5]
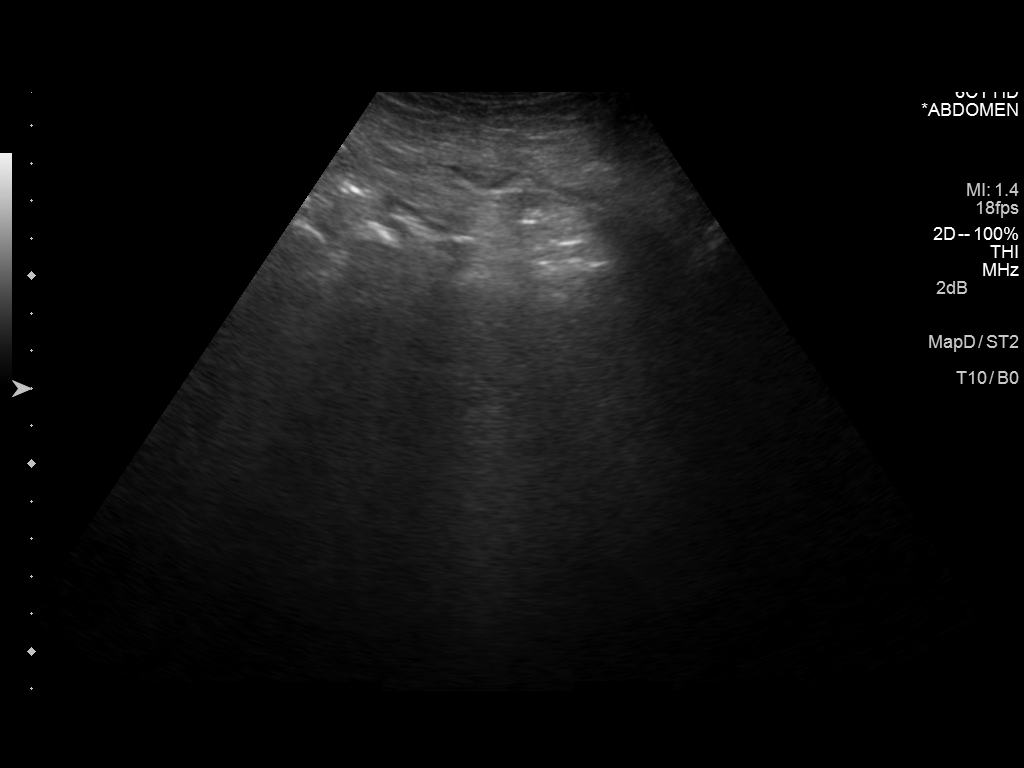

[5 of 5 positions shown; findings below may reference images not displayed]

Initial ultrasound scanning demonstrates a moderate amount of
ascites within the right lower abdominal quadrant. The right lower
abdomen was prepped and draped in the usual sterile fashion. 1%
lidocaine with epinephrine was used for local anesthesia. An
ultrasound image was saved for documentation purposed. An 8 Fr
Safe-T-Centesis catheter was introduced. The paracentesis was
performed. The catheter was removed and a dressing was applied. The
patient tolerated the procedure well without immediate post
procedural complication.
FINDINGS: A total of approximately 4.5 liters of serous fluid was removed.
IMPRESSION: Successful ultrasound-guided paracentesis yielding 4.5 liters of
peritoneal fluid.

## 2016-08-12 IMAGING — US US PARACENTESIS
1 series · 5 of 5 positions shown · non-contrast
Comparison: None.

MEDICATIONS:
None.

COMPLICATIONS:
None immediate

INDICATION: Recurrent symptomatic ascites

EXAM:
ULTRASOUND-GUIDED PARACENTESIS
TECHNIQUE: Informed written consent was obtained from the patient after a
discussion of the risks, benefits and alternatives to treatment. A
timeout was performed prior to the initiation of the procedure.

[Series 1: us paracentesis · 0.26mm/px · 5 of 5 slices shown]
[im 1/5]
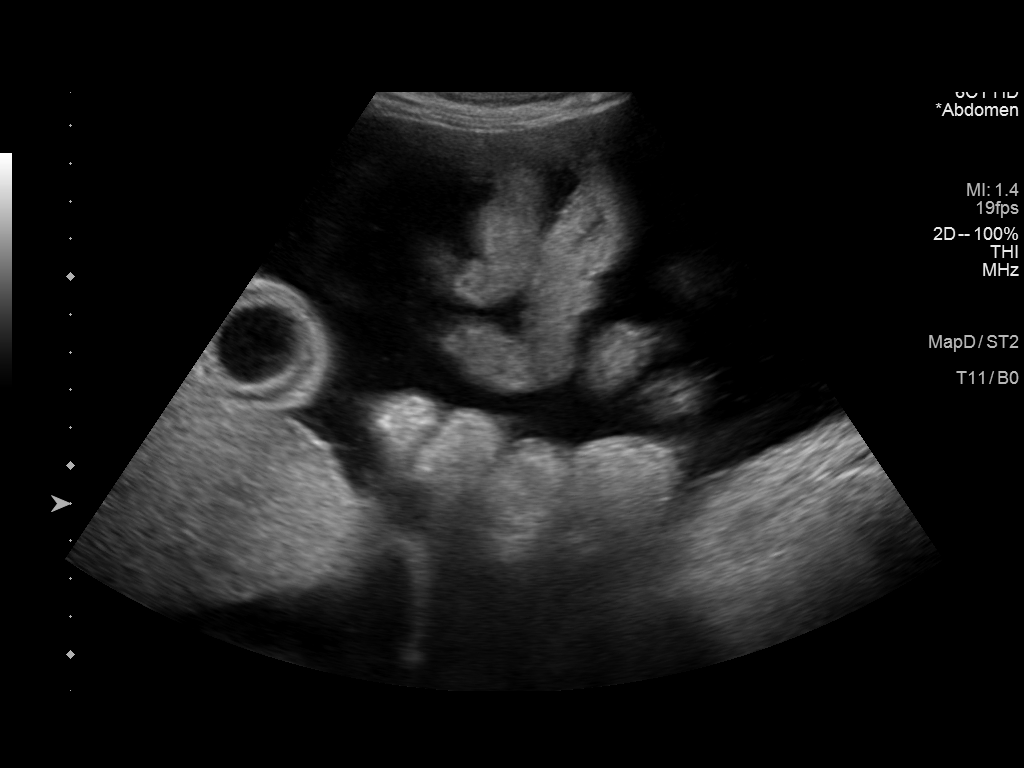
[im 2/5]
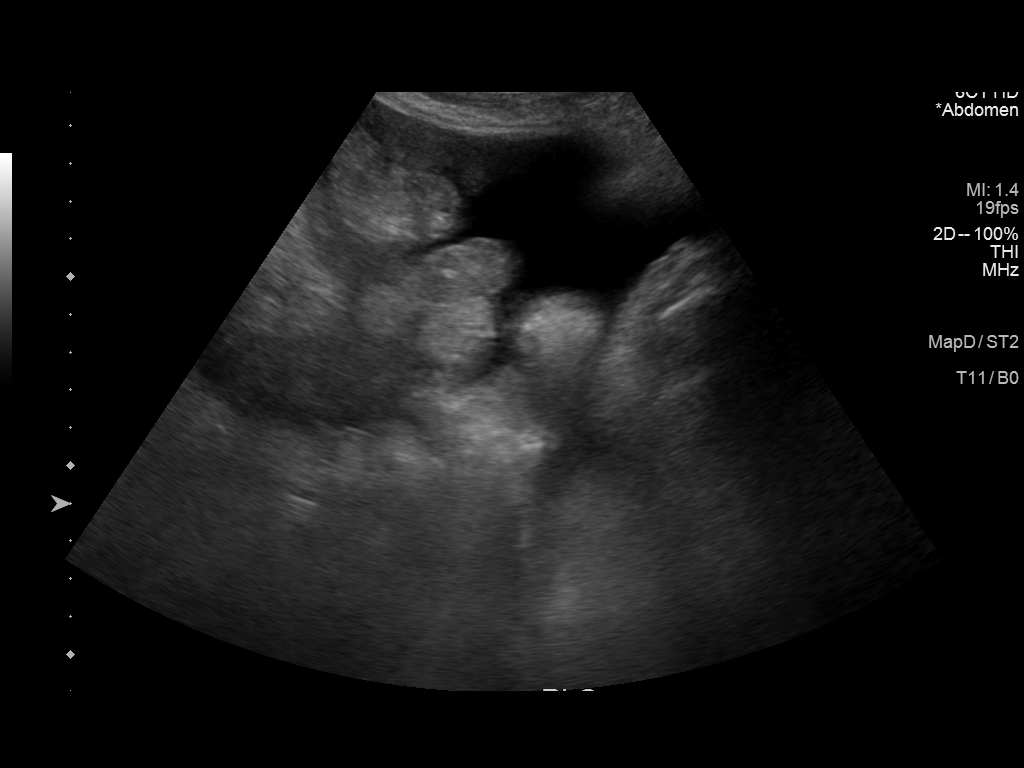
[im 3/5]
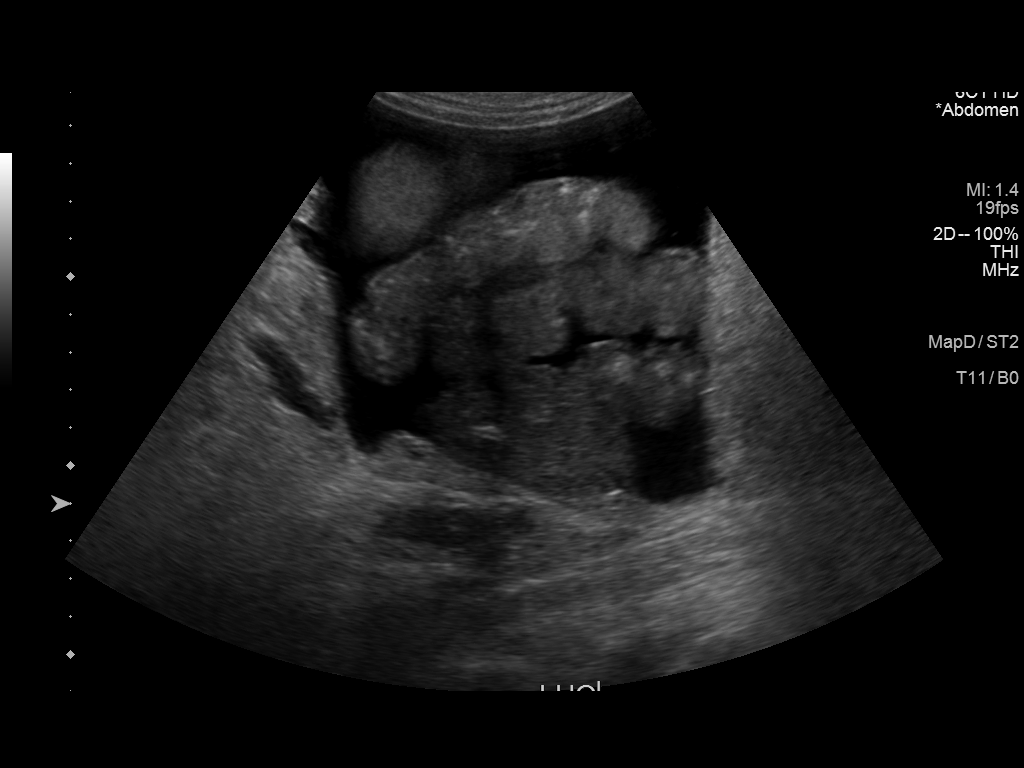
[im 4/5]
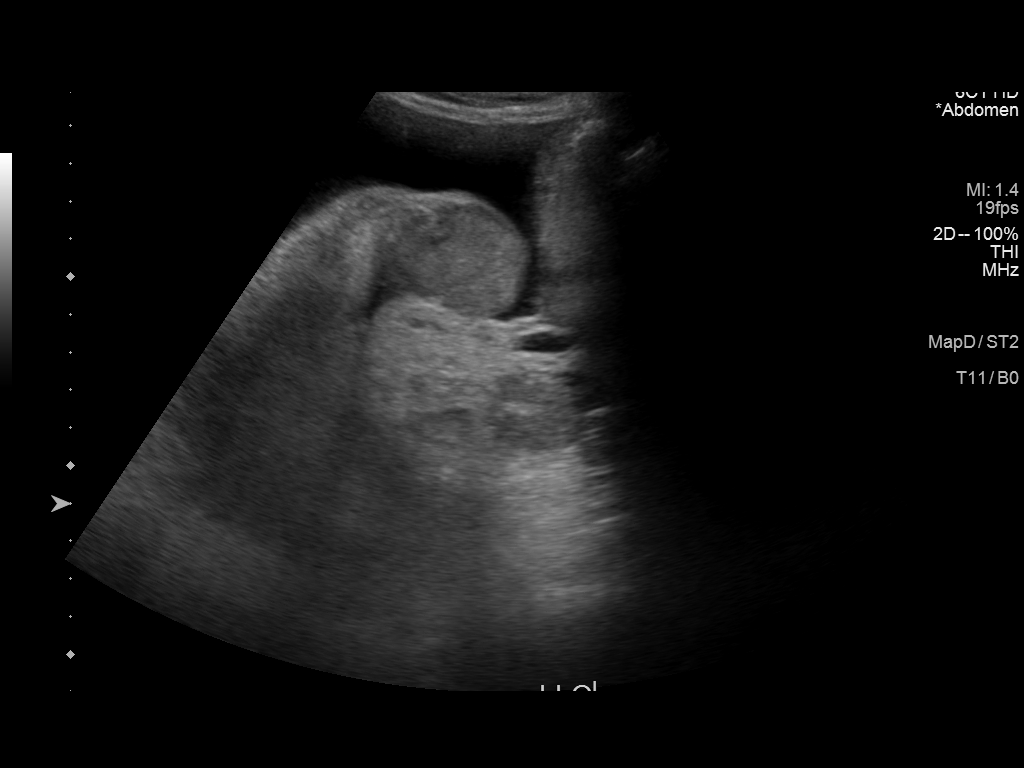
[im 5/5]
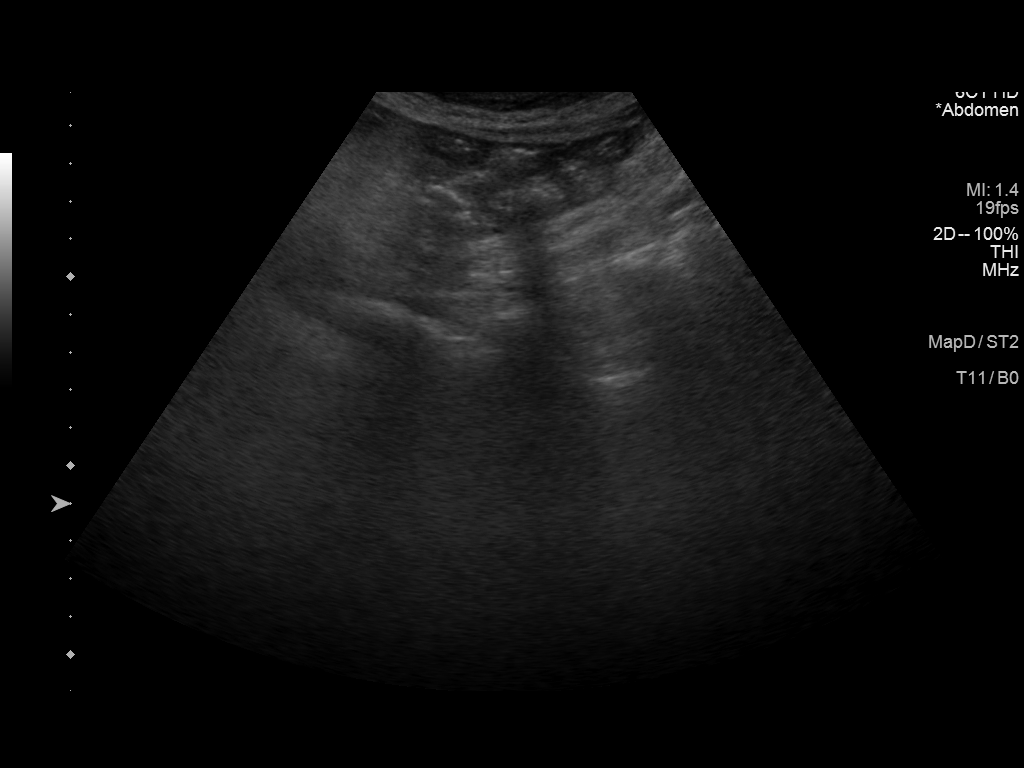

[5 of 5 positions shown; findings below may reference images not displayed]

Initial ultrasound scanning demonstrates a moderate amount of
ascites within the right lower abdominal quadrant. The right lower
abdomen was prepped and draped in the usual sterile fashion. 1%
lidocaine with epinephrine was used for local anesthesia. An
ultrasound image was saved for documentation purposed. An 8 Fr
Safe-T-Centesis catheter was introduced. The paracentesis was
performed. The catheter was removed and a dressing was applied. The
patient tolerated the procedure well without immediate post
procedural complication.
FINDINGS: A total of approximately 5.8 liters of serous fluid was removed.
IMPRESSION: Successful ultrasound-guided paracentesis yielding 5.8 liters of
peritoneal fluid.

## 2016-08-19 IMAGING — US US PARACENTESIS
1 series · 4 of 4 positions shown · non-contrast
Comparison: Multiple prior ultrasound-guided paracenteses

MEDICATIONS:
None.

COMPLICATIONS:
None immediate

INDICATION: Recurrent symptomatic ascites

EXAM:
ULTRASOUND-GUIDED PARACENTESIS
TECHNIQUE: Informed written consent was obtained from the patient after a
discussion of the risks, benefits and alternatives to treatment. A
timeout was performed prior to the initiation of the procedure.

[Series 1: us paracentesis · 0.25mm/px · 4 of 4 slices shown]
[im 1/4]
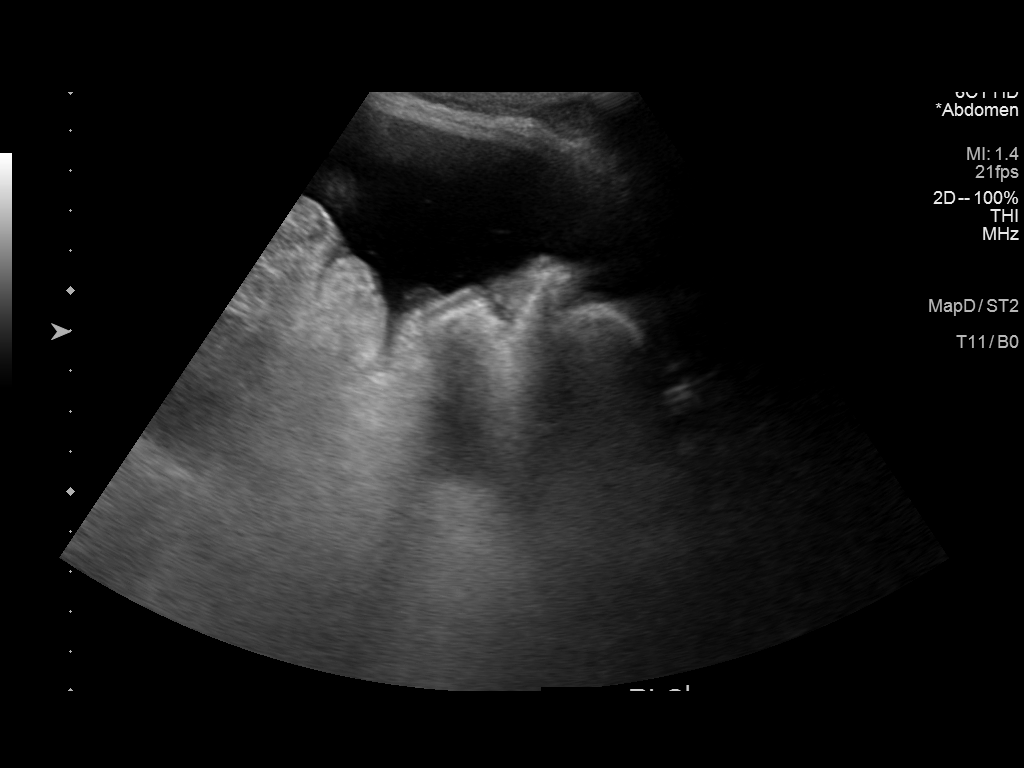
[im 2/4]
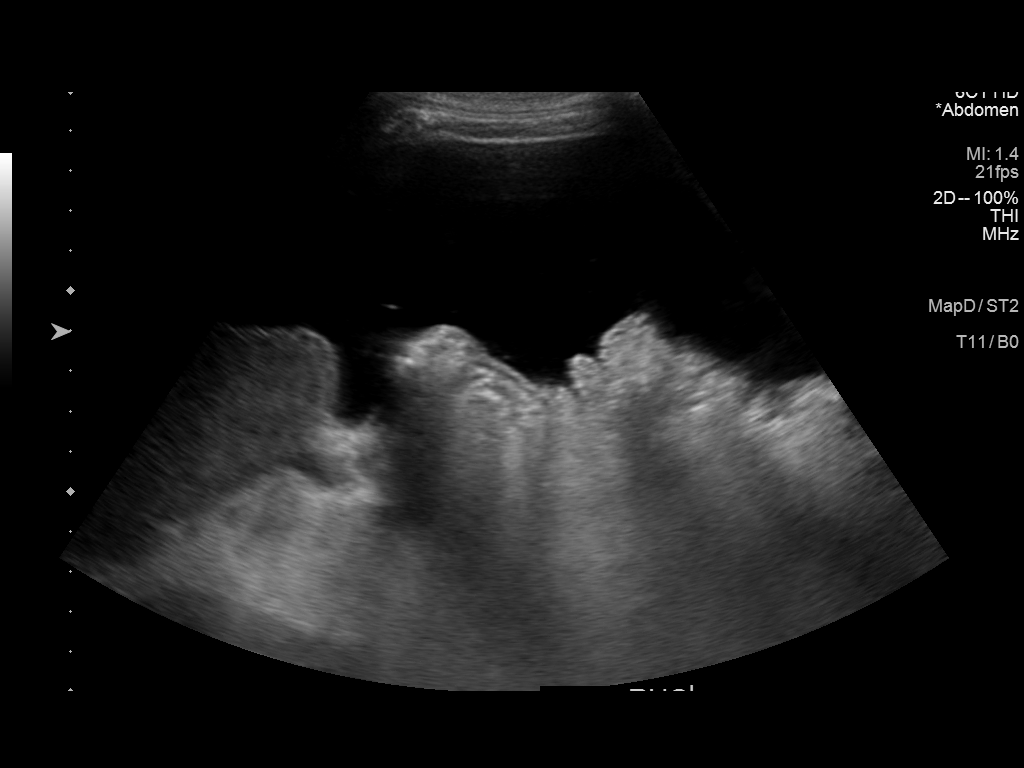
[im 3/4]
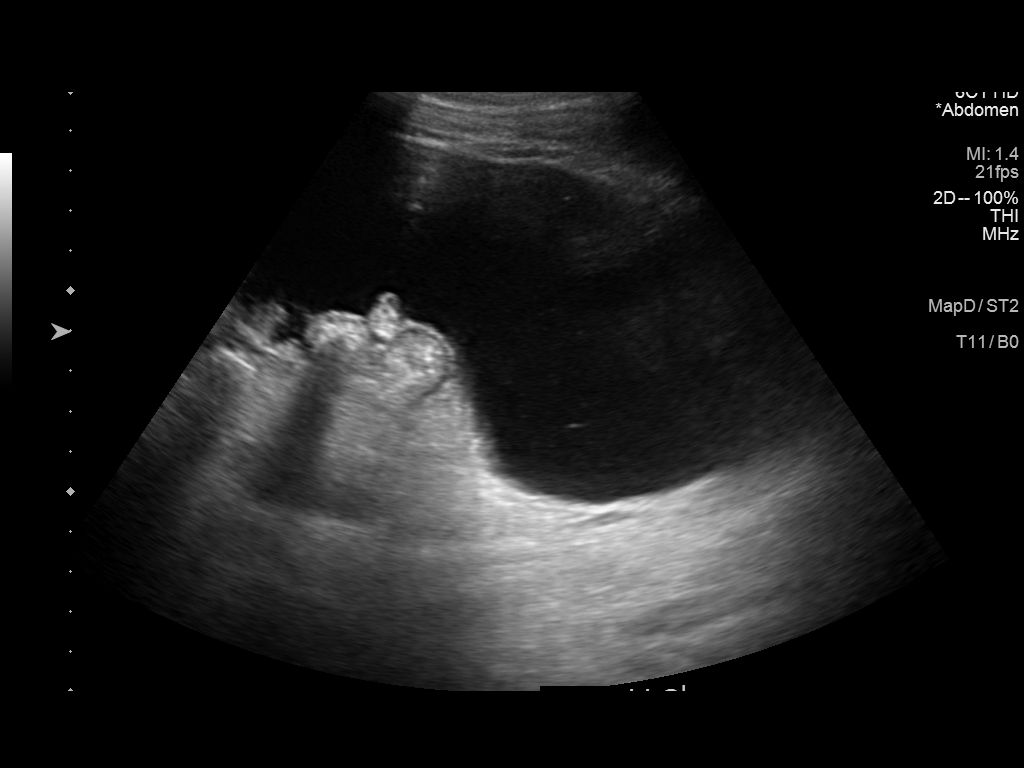
[im 4/4]
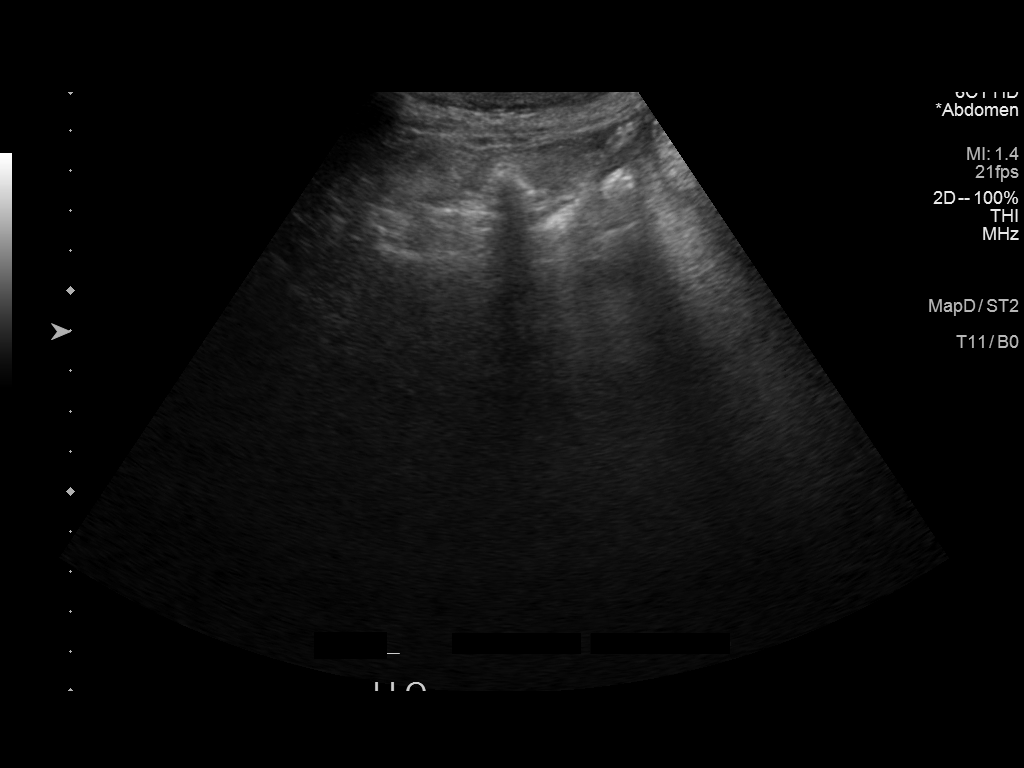

[4 of 4 positions shown; findings below may reference images not displayed]

Initial ultrasound scanning demonstrates a large amount of ascites
within the left lower abdominal quadrant. The right lower abdomen
was prepped and draped in the usual sterile fashion. 1% lidocaine
with epinephrine was used for local anesthesia. Under direct
ultrasound guidance, a 19 gauge, 7-cm, Yueh catheter was introduced.
An ultrasound image was saved for documentation purposed. The
paracentesis was performed. The catheter was removed and a dressing
was applied. The patient tolerated the procedure well without
immediate post procedural complication.
FINDINGS: A total of approximately 6.8 liters of serous fluid was removed.
IMPRESSION: Successful ultrasound-guided paracentesis yielding 6.8 liters of
peritoneal fluid.

## 2016-08-26 IMAGING — US US PARACENTESIS
1 series · 3 of 3 positions shown · non-contrast
Comparison: None.

CLINICAL DATA: Recurrent ascites.

EXAM:
ULTRASOUND GUIDED PARACENTESIS

[Series 1: us paracentesis · 0.26mm/px · 3 of 3 slices shown]
[im 1/3]
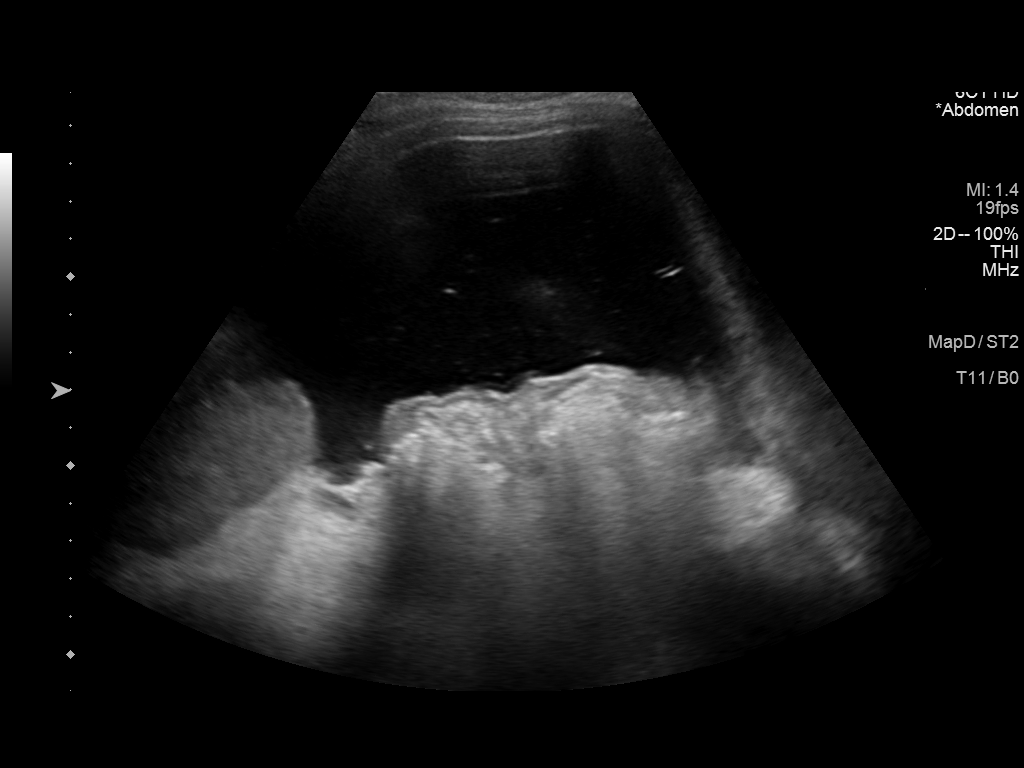
[im 2/3]
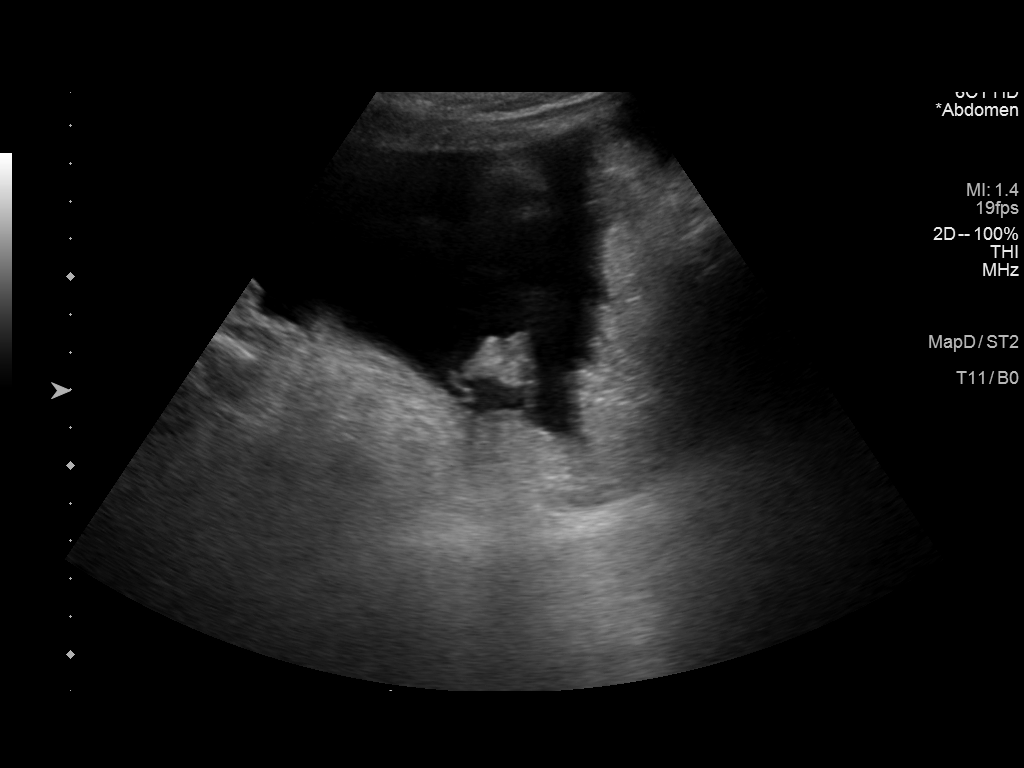
[im 3/3]
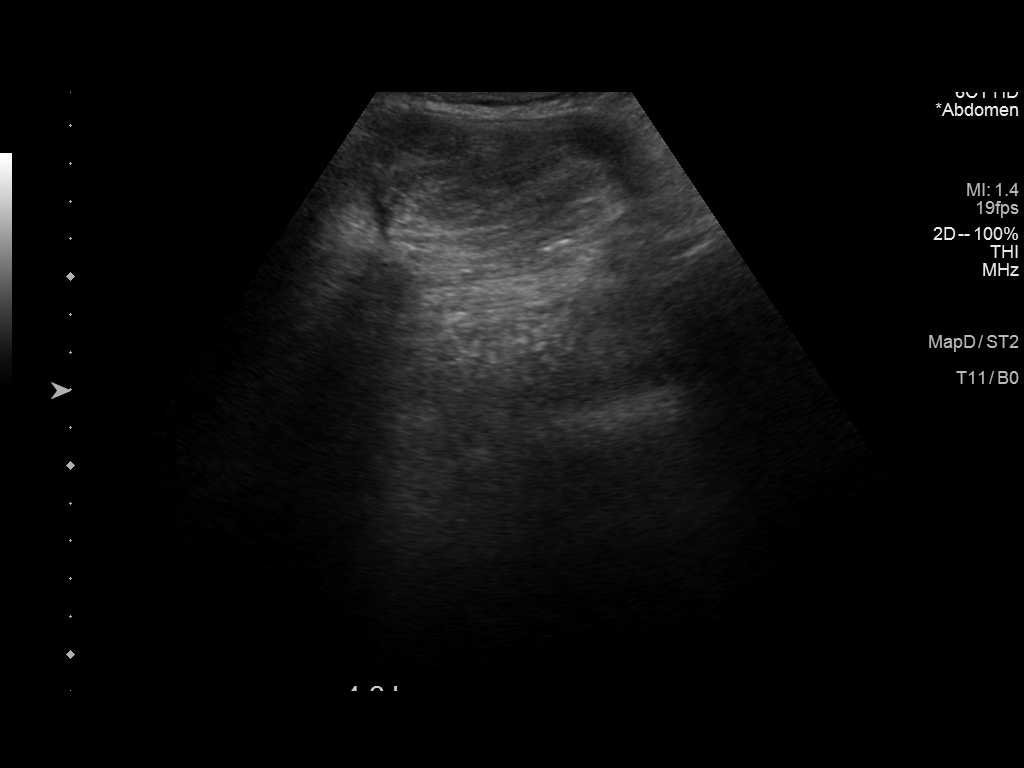

[3 of 3 positions shown; findings below may reference images not displayed]

PROCEDURE:
An ultrasound guided paracentesis was thoroughly discussed with the
patient and questions answered. The benefits, risks, alternatives
and complications were also discussed. The patient understands and
wishes to proceed with the procedure. Written consent was obtained.

Ultrasound was performed to localize and mark an adequate pocket of
fluid in the right lower quadrant of the abdomen. The area was then
prepped and draped in the normal sterile fashion. 1% Lidocaine was
used for local anesthesia. Under ultrasound guidance a
Safe-T-Centesis catheter was introduced. Paracentesis was performed.
The catheter was removed and a dressing applied.

COMPLICATIONS:
None.
FINDINGS: A total of approximately 4.2 L of yellow fluid was removed.

Estimated blood loss: Minimal
IMPRESSION: Successful ultrasound guided paracentesis yielding 4.2 L of ascites.

## 2016-09-30 IMAGING — US US PARACENTESIS
1 series · 5 of 5 positions shown · non-contrast
Comparison: 05/10/2015

CLINICAL DATA: Cirrhosis and ascites.

EXAM:
ULTRASOUND GUIDED  PARACENTESIS

[Series 1: us paracentesis · 0.30mm/px · 5 of 5 slices shown]
[im 1/5]
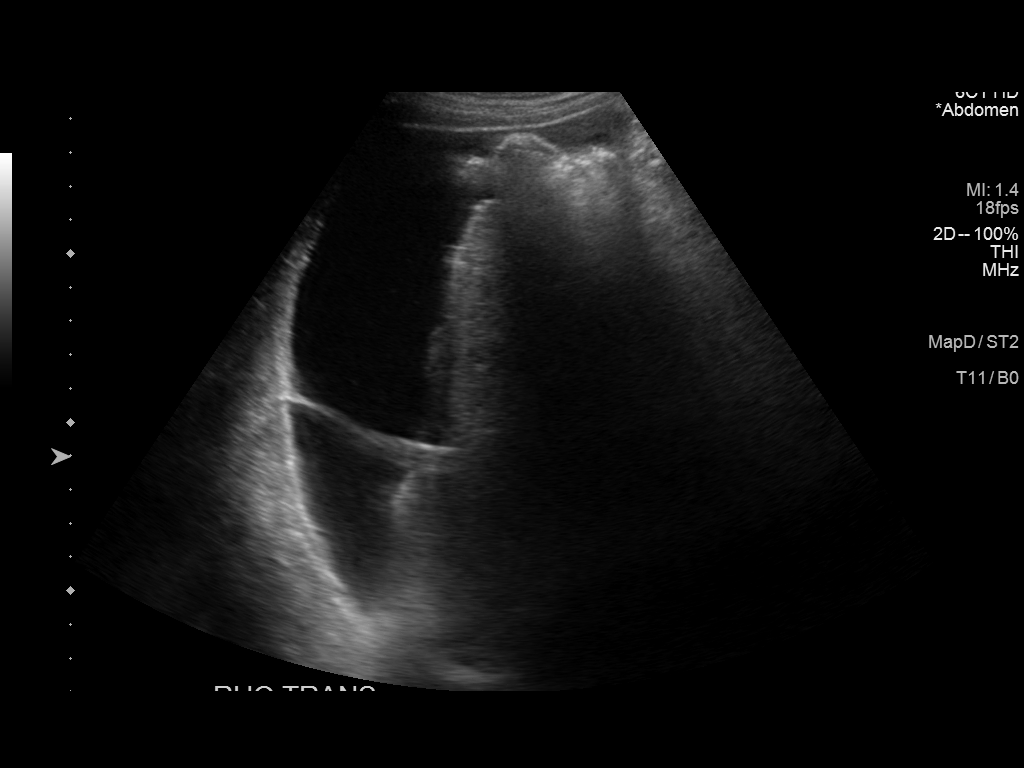
[im 2/5]
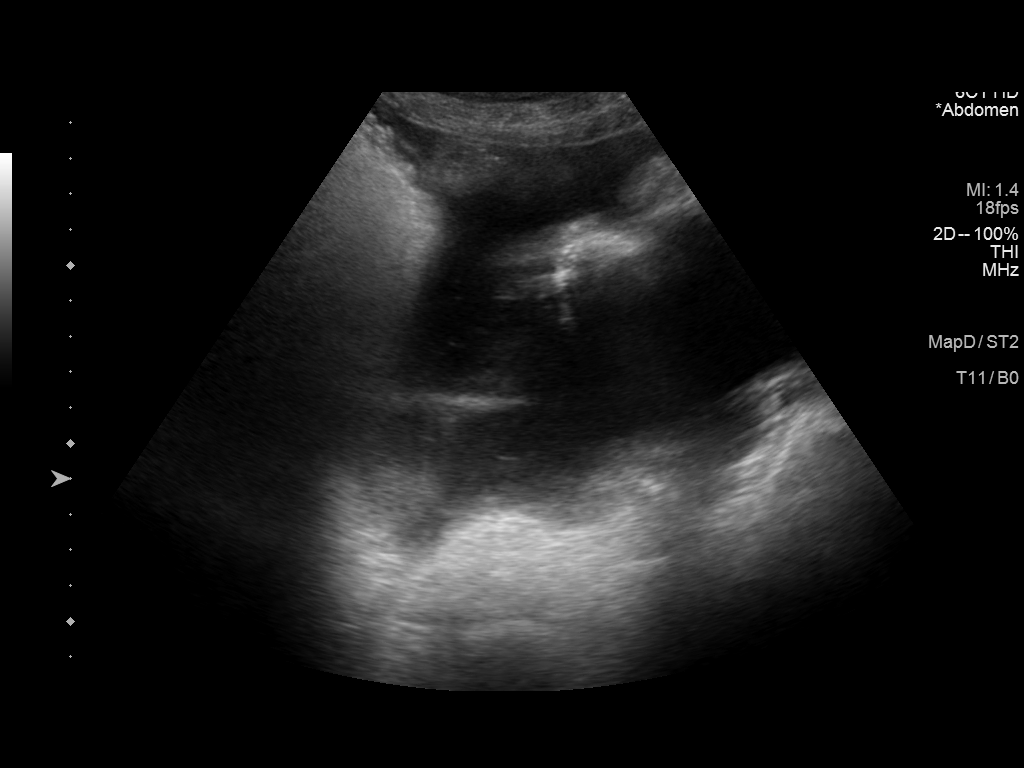
[im 3/5]
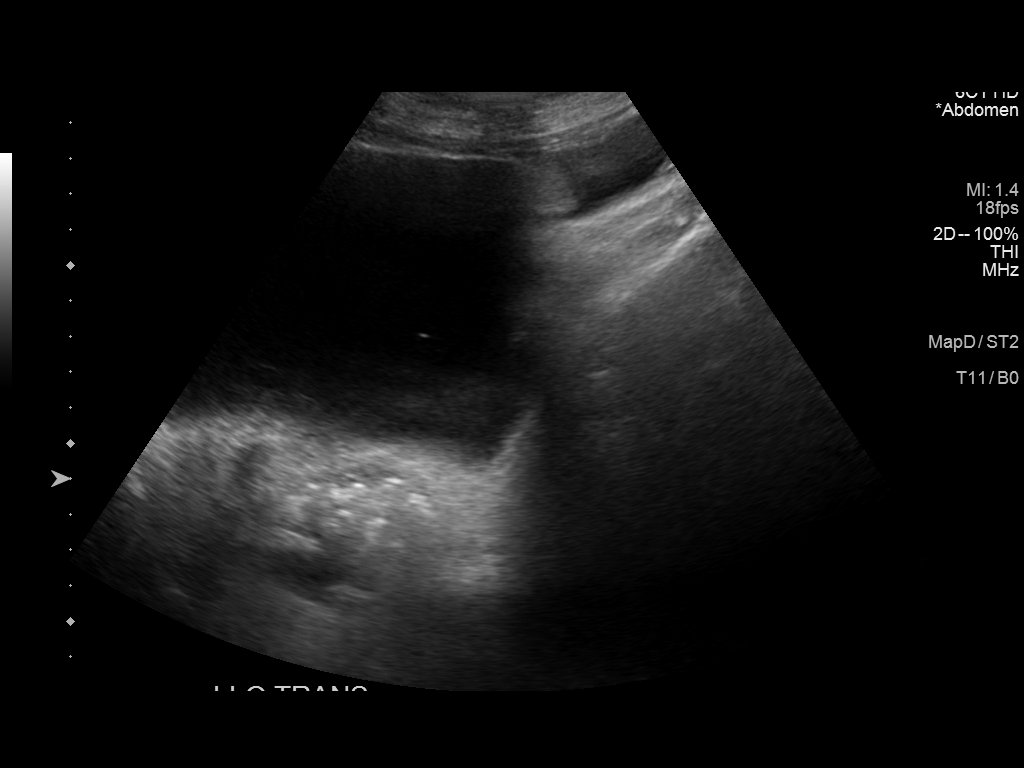
[im 4/5]
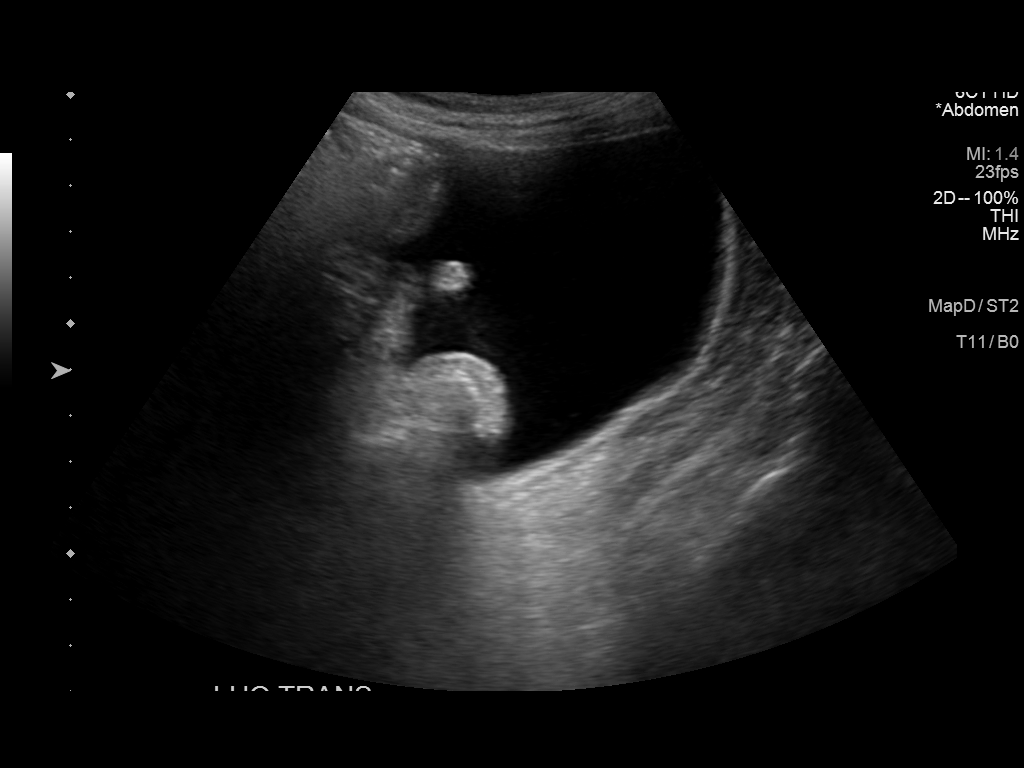
[im 5/5]
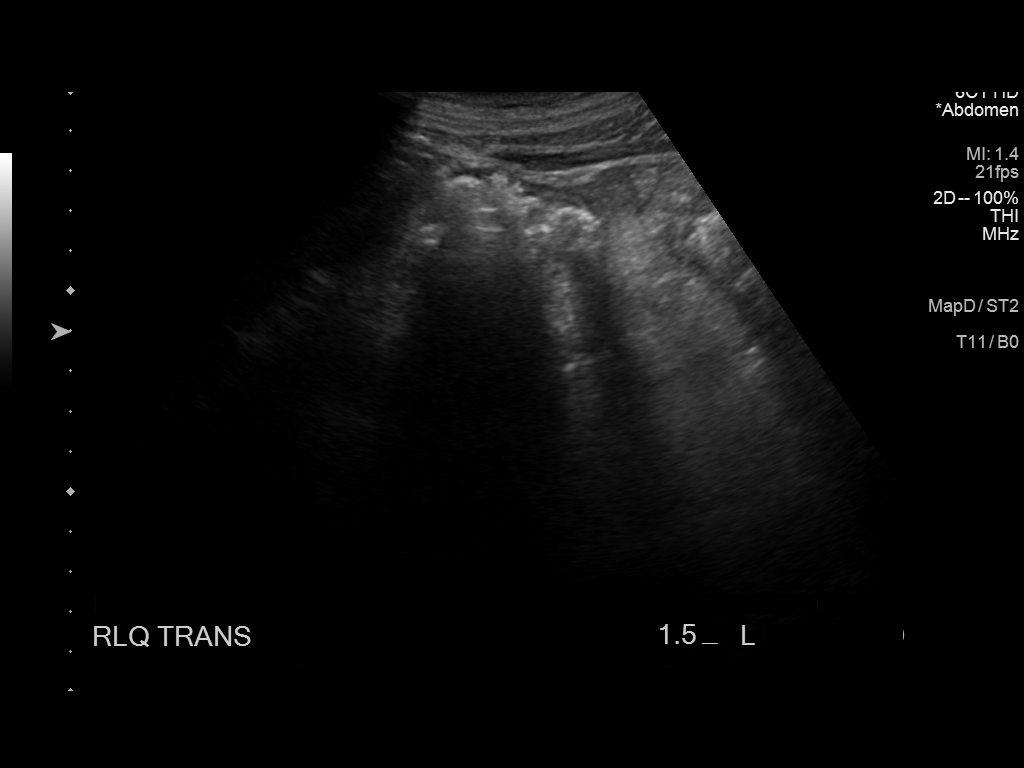

[5 of 5 positions shown; findings below may reference images not displayed]

PROCEDURE:
An ultrasound guided paracentesis was thoroughly discussed with the
patient and questions answered. The benefits, risks, alternatives
and complications were also discussed. The patient understands and
wishes to proceed with the procedure. Written consent was obtained.

Ultrasound was performed to localize and mark an adequate pocket of
fluid in the right mid lateral abdomen. The area was then prepped
and draped in the normal sterile fashion. 1% Lidocaine was used for
local anesthesia. Under ultrasound guidance a 6 French
Safe-T-Centesis catheter was introduced. Paracentesis was performed.
The catheter was removed and a dressing applied.

COMPLICATIONS:
None.
FINDINGS: By ultrasound, initial ascites volume did not appear to be as large
as the patient typically has prior to paracentesis. After discussing
whether she would like to proceed with the procedure today, decision
was made to proceed with paracentesis. A total of approximately
L of yellow fluid was removed. Postprocedural ultrasound shows no
significant fluid remaining.
IMPRESSION: Successful ultrasound guided paracentesis yielding 1.5 L of ascites.
This is a lower volume of fluid compared to the patient's usual
paracentesis procedures. The patient and her daughter were advised
that if she is not as distended as she usually is, she may be able
to increase the interval between paracentesis procedures.
